# Patient Record
Sex: Female | Born: 1970 | Race: Black or African American | Hispanic: No | Marital: Married | Smoking: Never smoker
Health system: Southern US, Community
[De-identification: ages and names within clinical notes are randomized; demographics above are authoritative.]

## PROBLEM LIST (undated history)

## (undated) DIAGNOSIS — M1990 Unspecified osteoarthritis, unspecified site: Secondary | ICD-10-CM

## (undated) DIAGNOSIS — M069 Rheumatoid arthritis, unspecified: Secondary | ICD-10-CM

## (undated) DIAGNOSIS — Z973 Presence of spectacles and contact lenses: Secondary | ICD-10-CM

## (undated) HISTORY — DX: Rheumatoid arthritis, unspecified: M06.9

## (undated) HISTORY — PX: WISDOM TOOTH EXTRACTION: SHX21

## (undated) HISTORY — PX: DILATION AND CURETTAGE OF UTERUS: SHX78

---

## 2003-12-19 ENCOUNTER — Emergency Department (HOSPITAL_COMMUNITY): Admission: EM | Admit: 2003-12-19 | Discharge: 2003-12-19 | Payer: Self-pay | Admitting: Emergency Medicine

## 2007-04-05 ENCOUNTER — Emergency Department: Payer: Self-pay | Admitting: Emergency Medicine

## 2007-04-05 ENCOUNTER — Other Ambulatory Visit: Payer: Self-pay

## 2013-01-12 ENCOUNTER — Ambulatory Visit: Payer: Self-pay

## 2013-01-19 ENCOUNTER — Ambulatory Visit: Payer: Self-pay

## 2013-03-18 ENCOUNTER — Ambulatory Visit
Admission: RE | Admit: 2013-03-18 | Discharge: 2013-03-18 | Disposition: A | Discharge status: Home / Self Care | Payer: BC Managed Care – PPO | Source: Ambulatory Visit | Attending: Orthopedic Surgery | Admitting: Orthopedic Surgery

## 2013-03-18 ENCOUNTER — Other Ambulatory Visit: Payer: Self-pay | Admitting: Orthopedic Surgery

## 2013-03-18 DIAGNOSIS — M2011 Hallux valgus (acquired), right foot: Secondary | ICD-10-CM

## 2013-03-18 DIAGNOSIS — M2012 Hallux valgus (acquired), left foot: Secondary | ICD-10-CM

## 2013-05-13 ENCOUNTER — Ambulatory Visit (INDEPENDENT_AMBULATORY_CARE_PROVIDER_SITE_OTHER): Payer: BC Managed Care – PPO | Admitting: Podiatry

## 2013-05-13 ENCOUNTER — Ambulatory Visit (INDEPENDENT_AMBULATORY_CARE_PROVIDER_SITE_OTHER): Payer: BC Managed Care – PPO

## 2013-05-13 ENCOUNTER — Encounter: Payer: Self-pay | Admitting: Podiatry

## 2013-05-13 VITALS — BP 98/48 | HR 81 | Resp 16 | Ht 68.0 in | Wt 180.0 lb

## 2013-05-13 DIAGNOSIS — M79671 Pain in right foot: Secondary | ICD-10-CM

## 2013-05-13 DIAGNOSIS — M2010 Hallux valgus (acquired), unspecified foot: Secondary | ICD-10-CM

## 2013-05-13 DIAGNOSIS — M21619 Bunion of unspecified foot: Secondary | ICD-10-CM

## 2013-05-13 DIAGNOSIS — M79609 Pain in unspecified limb: Secondary | ICD-10-CM

## 2013-05-13 DIAGNOSIS — M069 Rheumatoid arthritis, unspecified: Secondary | ICD-10-CM

## 2013-05-13 DIAGNOSIS — B351 Tinea unguium: Secondary | ICD-10-CM

## 2013-05-13 NOTE — Progress Notes (Signed)
   Subjective:    Patient ID: Orinda Kenner, female    DOB: 1971/01/03, 43 y.o.   MRN: [id number]  HPI Comments: N pain L b/l bunions D 43yrs O slowly C worse A standing, walking T soaks in epson salt, rest, seen dr Montez Morita in November 43-x-rays,   Foot Pain Associated symptoms include fatigue.      Review of Systems  Constitutional: Positive for fatigue.  HENT: Negative.   Eyes: Positive for itching.  Respiratory: Negative.   Cardiovascular: Negative.   Gastrointestinal: Negative.   Endocrine: Negative.   Genitourinary: Negative.   Musculoskeletal:       Joint pain  Skin: Negative.   Allergic/Immunologic: Negative.   Neurological: Negative.   Hematological: Negative.   Psychiatric/Behavioral: Negative.        Objective:   Physical Exam        Assessment & Plan:

## 2013-05-15 NOTE — Progress Notes (Signed)
Subjective:     Patient ID: Sharon Valenzuela, female   DOB: 1970/09/16, 43 y.o.   MRN: [id number]  Foot Pain   patient presents with bunion deformity of both feet that have been quite symptomatic left over right and plantar keratotic lesion of the substance metatarsal both feet that become painful with pressure. States that she was supposed to have surgery over over the holiday but it did not work out and she has been referred to me   Review of Systems  All other systems reviewed and are negative.       Objective:   Physical Exam  Nursing note and vitals reviewed. Constitutional: She is oriented to person, place, and time.  Cardiovascular: Intact distal pulses.   Musculoskeletal: Normal range of motion.  Neurological: She is oriented to person, place, and time.  Skin: Skin is warm.   neurovascular status intact with no equinus condition normal range of motion of the subtalar midtarsal joint and muscle strength adequate both feet. Patient has structural hyperostosis medial aspect first metatarsal left with redness around the first metatarsal head and plantar keratotic lesion fifth metatarsal both feet that are painful when pressed. 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 I have recommended distal osteotomy even though I don't think we will be able to get full correction but I do think it will be adequate for her as her deformity clinically is not as bad as it is on x-ray and I did explain this option versus proximal osteotomy and the differences in recovery and risk. She has opted for distal osteotomy and we'll have Austin-type procedure along with metatarsal osteotomy and is scheduled for consult in 1 week for correction of left foot

## 2013-05-18 ENCOUNTER — Ambulatory Visit: Payer: Self-pay | Admitting: Podiatry

## 2013-05-20 ENCOUNTER — Ambulatory Visit: Payer: BC Managed Care – PPO | Admitting: Podiatry

## 2013-05-20 ENCOUNTER — Ambulatory Visit: Payer: Self-pay | Admitting: Podiatry

## 2013-06-03 ENCOUNTER — Ambulatory Visit: Payer: Self-pay | Admitting: Podiatry

## 2013-06-10 ENCOUNTER — Ambulatory Visit: Payer: Self-pay | Admitting: Podiatry

## 2013-07-01 ENCOUNTER — Encounter: Payer: Self-pay | Admitting: Podiatry

## 2013-07-01 NOTE — Progress Notes (Signed)
Pt cancelled surgery on 3.5.15. Pt states we kept changing her appt dates that she decided to go with another doctor.

## 2013-07-04 ENCOUNTER — Encounter (HOSPITAL_BASED_OUTPATIENT_CLINIC_OR_DEPARTMENT_OTHER): Payer: Self-pay | Admitting: *Deleted

## 2013-07-04 NOTE — Progress Notes (Signed)
Pt has RA-off most of meds now-no labs needed

## 2013-07-06 ENCOUNTER — Other Ambulatory Visit: Payer: Self-pay | Admitting: Orthopedic Surgery

## 2013-07-06 NOTE — H&P (Signed)
Sharon Valenzuela is an 43 y.o. female.   Chief Complaint: Left hallux valgus HPI: Pt reports to OR for surgical correction of her left foot hallux valgus.  Pt has failed conservative treatment and seeks surgical treatment for pain and deformity of her left MThallux joint.    Past Medical History  Diagnosis Date  . Rheumatoid arthritis(714.0)   . Wears glasses     Past Surgical History  Procedure Laterality Date  . Cesarean section  1994  . Dilation and curettage of uterus    . Wisdom tooth extraction      History reviewed. No pertinent family history. Social History:  reports that she has never smoked. She has never used smokeless tobacco. She reports that she drinks alcohol. She reports that she does not use illicit drugs.  Allergies: No Known Allergies  No prescriptions prior to admission    No results found for this or any previous visit (from the past 48 hour(s)). No results found.  Review of Systems  Constitutional: Negative.   HENT: Negative.   Eyes: Negative.   Respiratory: Negative.   Cardiovascular: Negative.   Gastrointestinal: Negative.   Musculoskeletal: Negative.   Skin: Negative.   Neurological: Negative for tremors.  Endo/Heme/Allergies: Negative.   Psychiatric/Behavioral: The patient is not nervous/anxious.     Height 5\' 8"  (1.727 m), weight 81.647 kg (180 lb), last menstrual period 02/03/2013. Physical Exam  42y/o WD WN female in NAD, A/Ox3, appears stated age.  EOMI, mood and affect normal, respirations unlabored.  On physical exam pt has valgus deformity of her left MT hallux joint.  Decreased ROM of the left MT hallux joint. +TTP to left MT hallux joint. DP pulses 2+ b/l. Normal sensation to light touch intact. Skin healthy and intact.  Assessment/Plan Pt reports to OR for left 1st MT SCARF osteotomy; modified McBride bunionectomy and possible Akin osteotomy.   FLOWERS, CHRISTOPHER S 07/06/2013, 3:43 PM   Pt seen and examined.  Agree with note  above.  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  The risks and benefits of the alternative treatment options have been discussed in detail.  The patient wishes to proceed with surgery and specifically understands risks of bleeding, infection, nerve damage, blood clots, need for additional surgery, amputation and death.

## 2013-07-07 ENCOUNTER — Ambulatory Visit (HOSPITAL_BASED_OUTPATIENT_CLINIC_OR_DEPARTMENT_OTHER)
Admission: RE | Admit: 2013-07-07 | Discharge: 2013-07-07 | Disposition: A | Discharge status: Home / Self Care | Payer: BC Managed Care – PPO | Source: Ambulatory Visit | Attending: Orthopedic Surgery | Admitting: Orthopedic Surgery

## 2013-07-07 ENCOUNTER — Encounter (HOSPITAL_BASED_OUTPATIENT_CLINIC_OR_DEPARTMENT_OTHER)
Admission: RE | Disposition: A | Discharge status: Home / Self Care | Payer: Self-pay | Source: Ambulatory Visit | Attending: Orthopedic Surgery

## 2013-07-07 ENCOUNTER — Ambulatory Visit (HOSPITAL_BASED_OUTPATIENT_CLINIC_OR_DEPARTMENT_OTHER): Payer: BC Managed Care – PPO | Admitting: Certified Registered"

## 2013-07-07 ENCOUNTER — Encounter (HOSPITAL_BASED_OUTPATIENT_CLINIC_OR_DEPARTMENT_OTHER): Payer: BC Managed Care – PPO | Admitting: Certified Registered"

## 2013-07-07 ENCOUNTER — Encounter (HOSPITAL_BASED_OUTPATIENT_CLINIC_OR_DEPARTMENT_OTHER): Payer: Self-pay | Admitting: Certified Registered"

## 2013-07-07 DIAGNOSIS — Q66219 Congenital metatarsus primus varus, unspecified foot: Secondary | ICD-10-CM | POA: Insufficient documentation

## 2013-07-07 DIAGNOSIS — M069 Rheumatoid arthritis, unspecified: Secondary | ICD-10-CM | POA: Insufficient documentation

## 2013-07-07 DIAGNOSIS — M2010 Hallux valgus (acquired), unspecified foot: Secondary | ICD-10-CM | POA: Insufficient documentation

## 2013-07-07 HISTORY — PX: METATARSAL OSTEOTOMY WITH BUNIONECTOMY: SHX5662

## 2013-07-07 HISTORY — DX: Presence of spectacles and contact lenses: Z97.3

## 2013-07-07 LAB — POCT HEMOGLOBIN-HEMACUE: Hemoglobin: 12.1 g/dL (ref 12.0–15.0)

## 2013-07-07 SURGERY — BUNIONECTOMY, WITH METATARSAL OSTEOTOMY
Anesthesia: General | Site: Foot | Laterality: Left

## 2013-07-07 MED ORDER — MIDAZOLAM HCL 2 MG/2ML IJ SOLN
INTRAMUSCULAR | Status: AC
  Filled 2013-07-07: qty 2

## 2013-07-07 MED ORDER — BACITRACIN ZINC 500 UNIT/GM EX OINT
TOPICAL_OINTMENT | CUTANEOUS | Status: DC | PRN
  Administered 2013-07-07: 1 via TOPICAL

## 2013-07-07 MED ORDER — ONDANSETRON HCL 4 MG/2ML IJ SOLN
INTRAMUSCULAR | Status: DC | PRN
  Administered 2013-07-07: 4 mg via INTRAVENOUS

## 2013-07-07 MED ORDER — FENTANYL CITRATE 0.05 MG/ML IJ SOLN
INTRAMUSCULAR | Status: DC | PRN
  Administered 2013-07-07: 25 ug via INTRAVENOUS

## 2013-07-07 MED ORDER — SUCCINYLCHOLINE CHLORIDE 20 MG/ML IJ SOLN
INTRAMUSCULAR | Status: AC
  Filled 2013-07-07: qty 1

## 2013-07-07 MED ORDER — LACTATED RINGERS IV SOLN
INTRAVENOUS | Status: DC
  Administered 2013-07-07 (×2): via INTRAVENOUS

## 2013-07-07 MED ORDER — DEXAMETHASONE SODIUM PHOSPHATE 10 MG/ML IJ SOLN
INTRAMUSCULAR | Status: DC | PRN
  Administered 2013-07-07: 10 mg via INTRAVENOUS

## 2013-07-07 MED ORDER — HYDROMORPHONE HCL PF 1 MG/ML IJ SOLN: 0.2500 mg | INTRAMUSCULAR | Status: DC | PRN

## 2013-07-07 MED ORDER — SODIUM CHLORIDE 0.9 % IV SOLN: INTRAVENOUS | Status: DC

## 2013-07-07 MED ORDER — BUPIVACAINE HCL (PF) 0.25 % IJ SOLN
INTRAMUSCULAR | Status: DC | PRN
Start: 2013-07-07 — End: 2013-07-07
  Administered 2013-07-07: 20 mL via PERINEURAL

## 2013-07-07 MED ORDER — FENTANYL CITRATE 0.05 MG/ML IJ SOLN
INTRAMUSCULAR | Status: AC
  Filled 2013-07-07: qty 2

## 2013-07-07 MED ORDER — METOCLOPRAMIDE HCL 5 MG/ML IJ SOLN: 10.0000 mg | Freq: Once | INTRAMUSCULAR | Status: DC | PRN

## 2013-07-07 MED ORDER — MIDAZOLAM HCL 5 MG/5ML IJ SOLN
INTRAMUSCULAR | Status: DC | PRN
  Administered 2013-07-07: 2 mg via INTRAVENOUS

## 2013-07-07 MED ORDER — FENTANYL CITRATE 0.05 MG/ML IJ SOLN
INTRAMUSCULAR | Status: AC
  Filled 2013-07-07: qty 6

## 2013-07-07 MED ORDER — ACETAMINOPHEN 500 MG PO TABS
ORAL_TABLET | ORAL | Status: AC
  Filled 2013-07-07: qty 2

## 2013-07-07 MED ORDER — PROPOFOL 10 MG/ML IV BOLUS
INTRAVENOUS | Status: DC | PRN
  Administered 2013-07-07: 200 mg via INTRAVENOUS

## 2013-07-07 MED ORDER — PROPOFOL 10 MG/ML IV EMUL
INTRAVENOUS | Status: AC
  Filled 2013-07-07: qty 100

## 2013-07-07 MED ORDER — BUPIVACAINE-EPINEPHRINE PF 0.5-1:200000 % IJ SOLN
INTRAMUSCULAR | Status: AC
  Filled 2013-07-07: qty 30

## 2013-07-07 MED ORDER — LIDOCAINE HCL (CARDIAC) 20 MG/ML IV SOLN
INTRAVENOUS | Status: DC | PRN
  Administered 2013-07-07: 30 mg via INTRAVENOUS

## 2013-07-07 MED ORDER — CHLORHEXIDINE GLUCONATE 4 % EX LIQD: 60.0000 mL | Freq: Once | CUTANEOUS | Status: DC

## 2013-07-07 MED ORDER — BUPIVACAINE-EPINEPHRINE PF 0.5-1:200000 % IJ SOLN
INTRAMUSCULAR | Status: DC | PRN
  Administered 2013-07-07: 20 mL via PERINEURAL

## 2013-07-07 MED ORDER — OXYCODONE HCL 5 MG PO TABS
ORAL_TABLET | ORAL | Status: AC
Start: 2013-07-07 — End: 2013-07-07
  Filled 2013-07-07: qty 1

## 2013-07-07 MED ORDER — MIDAZOLAM HCL 2 MG/2ML IJ SOLN
1.0000 mg | INTRAMUSCULAR | Status: DC | PRN
  Administered 2013-07-07: 2 mg via INTRAVENOUS

## 2013-07-07 MED ORDER — 0.9 % SODIUM CHLORIDE (POUR BTL) OPTIME
TOPICAL | Status: DC | PRN
  Administered 2013-07-07: 200 mL

## 2013-07-07 MED ORDER — ACETAMINOPHEN 500 MG PO TABS
1000.0000 mg | ORAL_TABLET | Freq: Once | ORAL | Status: AC
  Administered 2013-07-07: 1000 mg via ORAL

## 2013-07-07 MED ORDER — FENTANYL CITRATE 0.05 MG/ML IJ SOLN
50.0000 ug | INTRAMUSCULAR | Status: DC | PRN
Start: 2013-07-07 — End: 2013-07-07
  Administered 2013-07-07: 100 ug via INTRAVENOUS

## 2013-07-07 MED ORDER — CEFAZOLIN SODIUM-DEXTROSE 2-3 GM-% IV SOLR
2.0000 g | INTRAVENOUS | Status: AC
  Administered 2013-07-07: 2 g via INTRAVENOUS

## 2013-07-07 MED ORDER — BACITRACIN ZINC 500 UNIT/GM EX OINT
TOPICAL_OINTMENT | CUTANEOUS | Status: AC
  Filled 2013-07-07: qty 28.35

## 2013-07-07 MED ORDER — OXYCODONE HCL 5 MG PO TABS: 5.0000 mg | ORAL_TABLET | ORAL | Status: DC | PRN

## 2013-07-07 MED ORDER — OXYCODONE HCL 5 MG/5ML PO SOLN: 5.0000 mg | Freq: Once | ORAL | Status: AC | PRN

## 2013-07-07 MED ORDER — OXYCODONE HCL 5 MG PO TABS
5.0000 mg | ORAL_TABLET | Freq: Once | ORAL | Status: AC | PRN
  Administered 2013-07-07: 5 mg via ORAL

## 2013-07-07 MED ORDER — CEFAZOLIN SODIUM-DEXTROSE 2-3 GM-% IV SOLR
INTRAVENOUS | Status: AC
  Filled 2013-07-07: qty 50

## 2013-07-07 SURGICAL SUPPLY — 69 items
BANDAGE ESMARK 6X9 LF (GAUZE/BANDAGES/DRESSINGS) ×1 IMPLANT
BIT DRILL 1.7 LOW PROFILE (BIT) ×2 IMPLANT
BLADE AVERAGE 25X9 (BLADE) ×2 IMPLANT
BLADE SURG 15 STRL LF DISP TIS (BLADE) ×2 IMPLANT
BLADE SURG 15 STRL SS (BLADE) ×2
BNDG COHESIVE 4X5 TAN STRL (GAUZE/BANDAGES/DRESSINGS) ×2 IMPLANT
BNDG COHESIVE 6X5 TAN STRL LF (GAUZE/BANDAGES/DRESSINGS) IMPLANT
BNDG CONFORM 3 STRL LF (GAUZE/BANDAGES/DRESSINGS) ×2 IMPLANT
BNDG ESMARK 6X9 LF (GAUZE/BANDAGES/DRESSINGS) ×2
CHLORAPREP W/TINT 26ML (MISCELLANEOUS) ×2 IMPLANT
COVER TABLE BACK 60X90 (DRAPES) ×2 IMPLANT
CUFF TOURNIQUET SINGLE 24IN (TOURNIQUET CUFF) IMPLANT
CUFF TOURNIQUET SINGLE 34IN LL (TOURNIQUET CUFF) ×2 IMPLANT
DRAPE EXTREMITY T 121X128X90 (DRAPE) ×2 IMPLANT
DRAPE OEC MINIVIEW 54X84 (DRAPES) ×2 IMPLANT
DRAPE U-SHAPE 47X51 STRL (DRAPES) ×2 IMPLANT
DRAPE U-SHAPE 76X120 STRL (DRAPES) IMPLANT
DRSG EMULSION OIL 3X3 NADH (GAUZE/BANDAGES/DRESSINGS) ×2 IMPLANT
ELECT REM PT RETURN 9FT ADLT (ELECTROSURGICAL) ×2
ELECTRODE REM PT RTRN 9FT ADLT (ELECTROSURGICAL) ×1 IMPLANT
GAUZE SPONGE 4X4 16PLY XRAY LF (GAUZE/BANDAGES/DRESSINGS) IMPLANT
GLOVE BIO SURGEON STRL SZ8 (GLOVE) ×2 IMPLANT
GLOVE BIOGEL PI IND STRL 7.0 (GLOVE) ×1 IMPLANT
GLOVE BIOGEL PI IND STRL 7.5 (GLOVE) ×1 IMPLANT
GLOVE BIOGEL PI IND STRL 8 (GLOVE) ×1 IMPLANT
GLOVE BIOGEL PI INDICATOR 7.0 (GLOVE) ×1
GLOVE BIOGEL PI INDICATOR 7.5 (GLOVE) ×1
GLOVE BIOGEL PI INDICATOR 8 (GLOVE) ×1
GLOVE ECLIPSE 6.5 STRL STRAW (GLOVE) ×2 IMPLANT
GLOVE ECLIPSE 7.0 STRL STRAW (GLOVE) ×2 IMPLANT
GLOVE EXAM NITRILE MD LF STRL (GLOVE) ×2 IMPLANT
GOWN STRL REUS W/ TWL LRG LVL3 (GOWN DISPOSABLE) ×1 IMPLANT
GOWN STRL REUS W/ TWL XL LVL3 (GOWN DISPOSABLE) ×1 IMPLANT
GOWN STRL REUS W/TWL LRG LVL3 (GOWN DISPOSABLE) ×1
GOWN STRL REUS W/TWL XL LVL3 (GOWN DISPOSABLE) ×1
GUIDEWIRE .08 (WIRE) ×2 IMPLANT
NS IRRIG 1000ML POUR BTL (IV SOLUTION) ×2 IMPLANT
PACK BASIN DAY SURGERY FS (CUSTOM PROCEDURE TRAY) ×2 IMPLANT
PAD CAST 4YDX4 CTTN HI CHSV (CAST SUPPLIES) ×1 IMPLANT
PADDING CAST ABS 4INX4YD NS (CAST SUPPLIES) ×1
PADDING CAST ABS COTTON 4X4 ST (CAST SUPPLIES) ×1 IMPLANT
PADDING CAST COTTON 4X4 STRL (CAST SUPPLIES) ×1
PENCIL BUTTON HOLSTER BLD 10FT (ELECTRODE) ×2 IMPLANT
SANITIZER HAND PURELL 535ML FO (MISCELLANEOUS) ×2 IMPLANT
SCREW CORTICAL 2.3X16 (Screw) ×2 IMPLANT
SCREW CORTICAL 2.3X18 (Screw) ×2 IMPLANT
SCREW LAG CANN 2.3X20MM (Screw) ×2 IMPLANT
SHEET MEDIUM DRAPE 40X70 STRL (DRAPES) ×2 IMPLANT
SLEEVE SCD COMPRESS KNEE MED (MISCELLANEOUS) ×2 IMPLANT
SPONGE GAUZE 4X4 12PLY (GAUZE/BANDAGES/DRESSINGS) ×2 IMPLANT
SPONGE LAP 18X18 X RAY DECT (DISPOSABLE) ×2 IMPLANT
STOCKINETTE 6  STRL (DRAPES) ×1
STOCKINETTE 6 STRL (DRAPES) ×1 IMPLANT
SUCTION FRAZIER TIP 10 FR DISP (SUCTIONS) IMPLANT
SUT ETHIBOND 3-0 V-5 (SUTURE) IMPLANT
SUT ETHILON 3 0 PS 1 (SUTURE) ×2 IMPLANT
SUT MNCRL AB 3-0 PS2 18 (SUTURE) ×2 IMPLANT
SUT VIC AB 0 SH 27 (SUTURE) IMPLANT
SUT VIC AB 2-0 SH 27 (SUTURE) ×1
SUT VIC AB 2-0 SH 27XBRD (SUTURE) ×1 IMPLANT
SUT VIC AB 3-0 PS1 18 (SUTURE)
SUT VIC AB 3-0 PS1 18XBRD (SUTURE) IMPLANT
SYR BULB 3OZ (MISCELLANEOUS) ×2 IMPLANT
SYR CONTROL 10ML LL (SYRINGE) IMPLANT
TOWEL OR 17X24 6PK STRL BLUE (TOWEL DISPOSABLE) ×2 IMPLANT
TOWEL OR NON WOVEN STRL DISP B (DISPOSABLE) IMPLANT
TUBE CONNECTING 20X1/4 (TUBING) IMPLANT
UNDERPAD 30X30 INCONTINENT (UNDERPADS AND DIAPERS) ×2 IMPLANT
YANKAUER SUCT BULB TIP NO VENT (SUCTIONS) IMPLANT

## 2013-07-07 NOTE — Anesthesia Preprocedure Evaluation (Addendum)
Anesthesia Evaluation  Patient identified by MRN, date of birth, ID band Patient awake    Reviewed: Allergy & Precautions, H&P , NPO status , Patient's Chart, lab work & pertinent test results, reviewed documented beta blocker date and time   Airway Mallampati: II TM Distance: >3 FB Neck ROM: full    Dental   Pulmonary neg pulmonary ROS,  breath sounds clear to auscultation        Cardiovascular negative cardio ROS  Rhythm:regular     Neuro/Psych negative neurological ROS  negative psych ROS   GI/Hepatic negative GI ROS, Neg liver ROS,   Endo/Other  negative endocrine ROS  Renal/GU negative Renal ROS  negative genitourinary   Musculoskeletal  (+) Arthritis -, Rheumatoid disorders,    Abdominal   Peds  Hematology negative hematology ROS (+)   Anesthesia Other Findings See surgeon's H&P   Reproductive/Obstetrics negative OB ROS                           Anesthesia Physical Anesthesia Plan  ASA: II  Anesthesia Plan: General   Post-op Pain Management:    Induction: Intravenous  Airway Management Planned: LMA  Additional Equipment:   Intra-op Plan:   Post-operative Plan:   Informed Consent: I have reviewed the patients History and Physical, chart, labs and discussed the procedure including the risks, benefits and alternatives for the proposed anesthesia with the patient or authorized representative who has indicated his/her understanding and acceptance.   Dental Advisory Given  Plan Discussed with: CRNA and Surgeon  Anesthesia Plan Comments:         Anesthesia Quick Evaluation

## 2013-07-07 NOTE — Discharge Instructions (Signed)
Toni Arthurs, MD [facility]  Please read the following information regarding your care after surgery.  Medications  You only need a prescription for the narcotic pain medicine (ex. oxycodone, Percocet, Norco).  All of the other medicines listed below are available over the counter. X acetominophen (Tylenol) 650 mg every 4-6 hours as you need for minor pain X oxycodone as prescribed for moderate to severe pain  Narcotic pain medicine (ex. oxycodone, Percocet, Vicodin) will cause constipation.  To prevent this problem, take the following medicines while you are taking any pain medicine. X docusate sodium (Colace) 100 mg twice a day X senna (Senokot) 2 tablets twice a day  X To help prevent blood clots, take an aspirin (325 mg) once a day for a month after surgery.  You should also get up every hour while you are awake to move around.    Weight Bearing ? Bear weight when you are able on your operated leg or foot. ? Bear weight only on the heel of your operated foot in the post-op shoe. X Do not bear any weight on the operated leg or foot.  Cast / Splint / Dressing X Keep your splint or cast clean and dry.  Dont put anything (coat hanger, pencil, etc) down inside of it.  If it gets damp, use a hair dryer on the cool setting to dry it.  If it gets soaked, call the office to schedule an appointment for a cast change. ? Remove your dressing 3 days after surgery and cover the incisions with dry dressings.    After your dressing, cast or splint is removed; you may shower, but do not soak or scrub the wound.  Allow the water to run over it, and then gently pat it dry.  Swelling It is normal for you to have swelling where you had surgery.  To reduce swelling and pain, keep your toes above your nose for at least 3 days after surgery.  It may be necessary to keep your foot or leg elevated for several weeks.  If it hurts, it should be elevated.  Follow Up Call my office at [phone]  when you are discharged from the hospital or surgery center to schedule an appointment to be seen two weeks after surgery.  Call my office at [phone] if you develop a fever >101.5 F, nausea, vomiting, bleeding from the surgical site or severe pain.     Post Anesthesia Home Care Instructions  Activity: Get plenty of rest for the remainder of the day. A responsible adult should stay with you for 24 hours following the procedure.  For the next 24 hours, DO NOT: -Drive a car -Advertising copywriter -Drink alcoholic beverages -Take any medication unless instructed by your physician -Make any legal decisions or sign important papers.  Meals: Start with liquid foods such as gelatin or soup. Progress to regular foods as tolerated. Avoid greasy, spicy, heavy foods. If nausea and/or vomiting occur, drink only clear liquids until the nausea and/or vomiting subsides. Call your physician if vomiting continues.  Special Instructions/Symptoms: Your throat may feel dry or sore from the anesthesia or the breathing tube placed in your throat during surgery. If this causes discomfort, gargle with warm salt water. The discomfort should disappear within 24 hours.  Call your surgeon if you experience:   1.  Fever over 101.0. 2.  Inability to urinate. 3.  Nausea and/or vomiting. 4.  Extreme swelling or bruising at the surgical site. 5.  Continued bleeding from the  incision. 6.  Increased pain, redness or drainage from the incision. 7.  Problems related to your pain medication.

## 2013-07-07 NOTE — Anesthesia Postprocedure Evaluation (Signed)
Anesthesia Post Note  Patient: Sharon Valenzuela  Procedure(s) Performed: Procedure(s) (LRB): LEFT FIRST METATARSAL SCARF OSTEOTOMY MODIFIED MCBRIDE BUNIONECTOMY WITH  AKIN OSTEOTOMY (Left)  Anesthesia type: General  Patient location: PACU  Post pain: Pain level controlled  Post assessment: Patient's Cardiovascular Status Stable  Last Vitals:  Filed Vitals:   07/07/13 1006  BP:   Pulse: 78  Temp:   Resp: 14    Post vital signs: Reviewed and stable  Level of consciousness: alert  Complications: No apparent anesthesia complications

## 2013-07-07 NOTE — Brief Op Note (Signed)
07/07/2013  8:55 AM  PATIENT:  Orinda Kenner  43 y.o. female  PRE-OPERATIVE DIAGNOSIS:  LEFT HALLUX VALGUS and metatarsus primus varus  POST-OPERATIVE DIAGNOSIS:  same  Procedure(s): 1.  Left 1st metatarsal scarf osteotomy 2.  Left modified McBride bunionectomy 3.  Left hallux Akin osteotomy 4.  Left foot AP and lateral radiographs  SURGEON:  Toni Arthurs, MD  ASSISTANT: n/a  ANESTHESIA:   General, regional  EBL:  minimal   TOURNIQUET:   Total Tourniquet Time Documented: Thigh (Left) - 57 minutes Total: Thigh (Left) - 57 minutes   COMPLICATIONS:  None apparent  DISPOSITION:  Extubated, awake and stable to recovery.  DICTATION ID:  [id number]

## 2013-07-07 NOTE — Progress Notes (Signed)
Assisted Dr. Frederick with left, ultrasound guided, popliteal/saphenous block. Side rails up, monitors on throughout procedure. See vital signs in flow sheet. Tolerated Procedure well. 

## 2013-07-07 NOTE — Anesthesia Procedure Notes (Addendum)
Anesthesia Regional Block:  Popliteal block  Pre-Anesthetic Checklist: ,, timeout performed, Correct Patient, Correct Site, Correct Laterality, Correct Procedure, Correct Position, site marked, Risks and benefits discussed,  Surgical consent,  Pre-op evaluation,  At surgeon's request and post-op pain management  Laterality: Left  Prep: chloraprep       Needles:   Needle Type: Other     Needle Length: 9cm 9 cm Needle Gauge: 21 and 21 G    Additional Needles:  Procedures: ultrasound guided (picture in chart) Popliteal block Narrative:  Start time: 07/07/2013 7:20 AM End time: 07/07/2013 7:26 AM Injection made incrementally with aspirations every 5 mL.  Performed by: Personally  Anesthesiologist: Aldona Lento, MD  Additional Notes: Ultrasound guidance used to: id relevant anatomy, confirm needle position, local anesthetic spread, avoidance of vascular puncture. Picture saved. No complications. Block performed personally by Janetta Hora. Gelene Mink, MD  .     Procedure Name: LMA Insertion Date/Time: 07/07/2013 7:36 AM Performed by: Valente Fosberg Pre-anesthesia Checklist: Patient identified, Emergency Drugs available, Suction available and Patient being monitored Patient Re-evaluated:Patient Re-evaluated prior to inductionOxygen Delivery Method: Circle System Utilized Preoxygenation: Pre-oxygenation with 100% oxygen Intubation Type: IV induction Ventilation: Mask ventilation without difficulty LMA: LMA inserted LMA Size: 4.0 Number of attempts: 1 Airway Equipment and Method: bite block Placement Confirmation: positive ETCO2 Tube secured with: Tape Dental Injury: Teeth and Oropharynx as per pre-operative assessment

## 2013-07-07 NOTE — Transfer of Care (Signed)
Immediate Anesthesia Transfer of Care Note  Patient: Sharon Valenzuela  Procedure(s) Performed: Procedure(s): LEFT FIRST METATARSAL SCARF OSTEOTOMY MODIFIED MCBRIDE BUNIONECTOMY WITH  AKIN OSTEOTOMY (Left)  Patient Location: PACU  Anesthesia Type:GA combined with regional for post-op pain  Level of Consciousness: awake, alert , oriented and patient cooperative  Airway & Oxygen Therapy: Patient Spontanous Breathing and Patient connected to face mask oxygen  Post-op Assessment: Report given to PACU RN and Post -op Vital signs reviewed and stable  Post vital signs: Reviewed and stable  Complications: No apparent anesthesia complications

## 2013-07-08 ENCOUNTER — Encounter (HOSPITAL_BASED_OUTPATIENT_CLINIC_OR_DEPARTMENT_OTHER): Payer: Self-pay | Admitting: Orthopedic Surgery

## 2013-07-08 NOTE — Op Note (Signed)
Sharon Valenzuela, Sharon Valenzuela NO.:  [id number]  MEDICAL RECORD NO.:  [id number]  LOCATION:                               FACILITY:  [facility]  PHYSICIAN:  Toni Arthurs, MD        DATE OF BIRTH:  Aug 10, 1970  DATE OF PROCEDURE:  07/07/2013 DATE OF DISCHARGE:  07/07/2013                              OPERATIVE REPORT   PREOPERATIVE DIAGNOSES: 1. Left hallux valgus. 2. Left metatarsus primus varus.  POSTOPERATIVE DIAGNOSES: 1. Left hallux valgus. 2. Left metatarsus primus varus. 3. Left hallux valgus interphalangeus.  PROCEDURE: 1. Left first metatarsal Scarf osteotomy. 2. Left modified McBride bunionectomy. 3. Left hallux Akin osteotomy. 4. Left foot AP and lateral radiographs.  SURGEON:  Toni Arthurs, MD  ANESTHESIA:  General, regional.  ESTIMATED BLOOD LOSS:  Minimal.  TOURNIQUET TIME:  57 minutes at 250 mmHg.  COMPLICATIONS:  None apparent.  DISPOSITION:  Extubated, awake, and stable to recovery.  INDICATIONS FOR PROCEDURE:  The patient is a 43 year old woman with past medical history significant for rheumatoid arthritis.  She has a painful bunion deformity, as well as metatarsus primus varus.  She presents now for operative treatment of this painful condition having failed nonoperative treatment.  She understands the risks and benefits, the alternative treatment options, and elects surgical treatment.  She specifically understands risks of bleeding, infection, nerve damage, blood clots, need for additional surgery, recurrence of her bunion amputation and death.  PROCEDURE IN DETAIL:  After preoperative consent was obtained and the correct operative site was identified, the patient was brought to the operating room and placed supine on the operating table.  General anesthesia was induced.  Preoperative antibiotics were administered.  A surgical time-out was taken.  The left lower extremity was prepped and draped in standard sterile fashion with the  tourniquet around the thigh. The extremity was exsanguinated.  The tourniquet was inflated to 250 mmHg.  A longitudinal incision was made at the dorsum of the first web space.  Sharp dissection was carried down through the skin and subcutaneous tissue.  The intermetatarsal ligament was identified.  It was divided under direct vision. A arthrotomy rather was made in the lateral joint capsule between the sesamoid and the metatarsal head.  Several perforations were then made in the lateral joint capsule adjacent to the first MTP joint.  The hallux could then be positioned in 20 degrees of varus passively.  Attention was then turned to the medial eminence were a longitudinal incision was made.  Sharp dissection was carried down through the skin and subcutaneous tissue.  Medial joint capsule was incised in line with its fibers, and elevated plantarly and dorsally.  The medial eminence was resected in line with the first metatarsal shaft.  The corners of the scarf osteotomy were then marked with 0.054 K-wires.  An oscillating saw was used to make the osteotomy removing a small wedge of bone proximally.  K-wires were removed.  The osteotomy was mobilized.  The head was translated laterally to correct the intermetatarsal angle.  The correction was then provisionally held with a tenaculum.  An AP radiograph confirmed appropriate correction of the intermetatarsal angle and coverage of the  sesamoids.  The osteotomy was then fixed with a unicortical 2.3-mm screw distally and a bicortical 2.3-mm screw proximally.  These were Arthrex quick fix screws.  The overhanging bone medially was resected with the oscillating saw. The wound was irrigated copiously.  Decision was made at that time to proceed with an Akin osteotomy to correct the residual hallux valgus interphalangeus.  The distal portion of the incision was extended along the midline of the hallux.  Subperiosteal dissection was carried dorsally  and plantarly.  Hohmann elevators were placed to protect the flexor and extensor tendons.  The closing wedge osteotomy was then made at the base of the proximal phalanx removing a small wedge of bone medially.  The osteotomy was closed down and fixed with a K-wire, followed by a 2.3 mm cannulated partially-threaded screw.  The screw was noted to have appropriate purchase.  AP and lateral radiographs then confirmed appropriate position and length of all hardware and appropriate correction of the hallux valgus, intermetatarsal and hallux valgus interphalangeus ankles.  Both wounds were then irrigated copiously.  The medial joint capsule was repaired with imbricating sutures of 2-0 Vicryl.  Subcutaneous tissue was approximated with an inverted simple sutures of 3-0 Monocryl and running 3-0 nylon sutures were used to close the incisions.  Sterile dressings were applied, followed by a blunt dressing.  The tourniquet was released at 57 minutes after application of the dressings.  The patient was awakened by anesthesia and transported to the recovery room in stable condition.  FOLLOWUP PLAN:  The patient will be weightbearing as tolerated on her left foot and a DARCO wedge style shoe.  She will follow up with me in 2 weeks for suture removal.  RADIOGRAPHS:  AP and lateral radiographs of the left foot were obtained intraoperatively today.  These show interval correction of the hallux valgus, intermetatarsal and hallux valgus interphalangeus angles.  The hardware was appropriately positioned and of the appropriate length.  No acute injuries noted.    Toni Arthurs, MD   ______________________________ Toni Arthurs, MD   JH/MEDQ  D:  07/07/2013  T:  07/08/2013  Job:  [id number]

## 2013-07-15 ENCOUNTER — Encounter: Payer: BC Managed Care – PPO | Admitting: Podiatry

## 2013-08-06 ENCOUNTER — Encounter (HOSPITAL_COMMUNITY): Payer: Self-pay | Admitting: Emergency Medicine

## 2013-08-06 ENCOUNTER — Emergency Department (INDEPENDENT_AMBULATORY_CARE_PROVIDER_SITE_OTHER)
Admission: EM | Admit: 2013-08-06 | Discharge: 2013-08-06 | Disposition: A | Discharge status: Home / Self Care | Payer: BC Managed Care – PPO | Source: Home / Self Care | Attending: Family Medicine | Admitting: Family Medicine

## 2013-08-06 DIAGNOSIS — T8130XA Disruption of wound, unspecified, initial encounter: Secondary | ICD-10-CM

## 2013-08-06 DIAGNOSIS — T8131XA Disruption of external operation (surgical) wound, not elsewhere classified, initial encounter: Secondary | ICD-10-CM

## 2013-08-06 MED ORDER — CLINDAMYCIN HCL 300 MG PO CAPS: 300.0000 mg | ORAL_CAPSULE | Freq: Three times a day (TID) | ORAL | Status: DC

## 2013-08-06 NOTE — ED Notes (Signed)
Pt c/o left foot swelling onset x1 week Reports she had a bunionectomy done on 03/05 States the swelling started after she hit the edge of a plastic heater?? Denies pain, fevers Saw the ortho surgeon 2 weeks ago Alert w/no signs of acute distress.

## 2013-08-06 NOTE — ED Provider Notes (Signed)
Medical screening examination/treatment/procedure(s) were performed by resident physician or non-physician practitioner and as supervising physician I was immediately available for consultation/collaboration.   KINDL,JAMES DOUGLAS MD.   James D Kindl, MD 08/06/13 1755 

## 2013-08-06 NOTE — Discharge Instructions (Signed)
Wound Dehiscence  Wound dehiscence is when a surgical cut (incision) breaks open and does not heal properly after surgery. It usually happens 7 10 days after surgery. This can be a serious condition. It is important to identify and treat this condition early.   CAUSES   Some common causes of wound dehiscence include:  · Stretching of the wound area. This may be caused by lifting, vomiting, violent coughing, or straining during bowel movements.  · Wound infection.  · Early stitch (suture) removal.  RISK FACTORS  Various things can increase your risk of developing wound dehiscence, including:  · Obesity.  · Lung disease.  · Smoking.  · Poor nutrition.  · Contamination during surgery.  SIGNS AND SYMPTOMS  · Bleeding from the wound.  · Pain.  · Fever.  · Wound starts breaking open.  DIAGNOSIS   · Your health care provider may diagnose wound dehiscence by monitoring the incision and noting any changes in the wound. These changes can include an increase in drainage or pain. The health care provider may also ask you if you have noticed any stretching or tearing of the wound.  · Wound cultures may be taken to determine if there is an infection.   · Imaging studies, such as an MRI scan or CT scan, may be done to determine if there is a collection of pus or fluid in the wound area.  TREATMENT  Treatment may include:  · Wound care.  · Surgical repair.  · Antibiotic medicine to treat or prevent infection.  · Medicines to reduce pain and swelling.  HOME CARE INSTRUCTIONS   · Only take over-the-counter or prescription medicines for pain, discomfort, or fever as directed by your health care provider. Taking pain medicine 30 minutes before changing a bandage (dressing) can help relieve pain.  · Take your antibiotics as directed. Finish them even if you start to feel better.  · Gently wash the area with mild soap and water 2 times a day, or as directed. Rinse off the soap. Pat the area dry with a clean towel. Do not rub the wound.  This may cause bleeding.  · Follow your health care provider's instructions for how often you need to change the dressing and packing inside. Wash your hands well before and after changing your dressing. Apply a dressing to the wound as directed.  · Take showers. Do not take tub baths, swim, or do anything that may soak the wound until it is healed.  · Avoid exercises that make you sweat heavily.  · Use anti-itch medicine as directed by your health care provider. The wound may itch when it is healing. Do not pick or scratch at the wound.  · Do not lift more than 10 pounds (4.5 kg) until the wound is healed, or as directed by your health care provider.  · Keep all follow-up appointments as directed.  SEEK MEDICAL CARE IF:  · You have excessive bleeding from your surgical wound.  · Your wound does not seem to be healing properly.  SEEK IMMEDIATE MEDICAL CARE IF:   · You have increased swelling or redness around the wound.  · You have increasing pain in the wound.  · You have an increasing amount of pus coming from the wound.  · Your wound breaks open farther.  · You have a fever.  MAKE SURE YOU:   · Understand these instructions.  · Will watch your condition.  · Will get help right away if you are not   doing well or get worse.  Document Released: 07/12/2003 Document Revised: 02/09/2013 Document Reviewed: 12/27/2012  [employer]® Patient Information ©2014 [employer].

## 2013-08-06 NOTE — ED Provider Notes (Signed)
CSN: [id number]     Arrival date & time 08/06/13  1428 History   First MD Initiated Contact with Patient 08/06/13 1507     Chief Complaint  Patient presents with  . Foot Swelling   (Consider location/radiation/quality/duration/timing/severity/associated sxs/prior Treatment) HPI Comments: 43 year old female presents complaining of left foot swelling for about a week. She had a bunionectomy one month ago and and was doing well until 2 weeks ago when she accidentally kicked something. Since then, she has had progressive swelling and now seems at the incision site started to pull apart by the end of the day. She is still wearing a walking boot and she thinks this may be making it worse. She saw her orthopedic surgeon immediately after she kicked something 2 weeks ago but has not followed up there since. She says there is some foul-smelling drainage from the wound is well. There is no pain in the wound   Past Medical History  Diagnosis Date  . Rheumatoid arthritis(714.0)   . Wears glasses    Past Surgical History  Procedure Laterality Date  . Cesarean section  1994  . Dilation and curettage of uterus    . Wisdom tooth extraction    . Metatarsal osteotomy with bunionectomy Left 07/07/2013    Procedure: LEFT FIRST METATARSAL SCARF OSTEOTOMY MODIFIED MCBRIDE BUNIONECTOMY WITH  AKIN OSTEOTOMY;  Surgeon: Toni Arthurs, MD;  Location: [facility];  Service: Orthopedics;  Laterality: Left;   No family history on file. History  Substance Use Topics  . Smoking status: Never Smoker   . Smokeless tobacco: Never Used  . Alcohol Use: Yes     Comment: weekends/social   OB History   Grav Para Term Preterm Abortions TAB SAB Ect Mult Living                 Review of Systems  Constitutional: Positive for fever and chills.  Skin: Positive for wound. Negative for color change.  Neurological: Negative for numbness.  All other systems reviewed and are negative.    Allergies  Review of  patient's allergies indicates no known allergies.  Home Medications   Current Outpatient Rx  Name  Route  Sig  Dispense  Refill  . cholecalciferol (VITAMIN D) 1000 UNITS tablet   Oral   Take 1,000 Units by mouth daily.         . clindamycin (CLEOCIN) 300 MG capsule   Oral   Take 1 capsule (300 mg total) by mouth 3 (three) times daily.   30 capsule   0   . oxyCODONE (ROXICODONE) 5 MG immediate release tablet   Oral   Take 1-2 tablets (5-10 mg total) by mouth every 4 (four) hours as needed for moderate pain or severe pain.   30 tablet   0   . vitamin B-12 (CYANOCOBALAMIN) 100 MCG tablet   Oral   Take 100 mcg by mouth daily.          BP 130/85  Pulse 77  Temp(Src) 98.5 F (36.9 C) (Oral)  Resp 18  SpO2 100% Physical Exam  Nursing note and vitals reviewed. Constitutional: She is oriented to person, place, and time. Vital signs are normal. She appears well-developed and well-nourished. No distress.  HENT:  Head: Normocephalic and atraumatic.  Pulmonary/Chest: Effort normal. No respiratory distress.  Musculoskeletal:       Feet:  Neurological: She is alert and oriented to person, place, and time. She has normal strength. Coordination normal.  Skin: Skin is warm  and dry. No rash noted. She is not diaphoretic.  Psychiatric: She has a normal mood and affect. Judgment normal.    ED Course  Procedures (including critical care time) Labs Review Labs Reviewed - No data to display Imaging Review No results found.   MDM   1. Wound dehiscence    Wound steri-stripped to hold it closed.  Will Rx cleocin for possible early wound infection or infection PPx.  She will keep the foot elevated and rest, and will follow up with her orthopedic surgeon on Monday    Meds ordered this encounter  Medications  . DISCONTD: clindamycin (CLEOCIN) 300 MG capsule    Sig: Take 1 capsule (300 mg total) by mouth 3 (three) times daily.    Dispense:  30 capsule    Refill:  0    Order  Specific Question:  Supervising Provider    Answer:  Linna Hoff [phone]  . clindamycin (CLEOCIN) 300 MG capsule    Sig: Take 1 capsule (300 mg total) by mouth 3 (three) times daily.    Dispense:  30 capsule    Refill:  0    Order Specific Question:  Supervising Provider    Answer:  Bradd Canary D [[id number]]       Graylon Good, PA-C 08/06/13 1539

## 2014-04-20 IMAGING — CR DG FOOT COMPLETE 3+V*L*
3 series · 3 of 3 positions shown · non-contrast
Comparison: None.

CLINICAL DATA: 42-year-old female with bunion of the left foot 1st
toe. Initial encounter.

EXAM:
LEFT FOOT - COMPLETE 3+ VIEW

[t foot ap left]
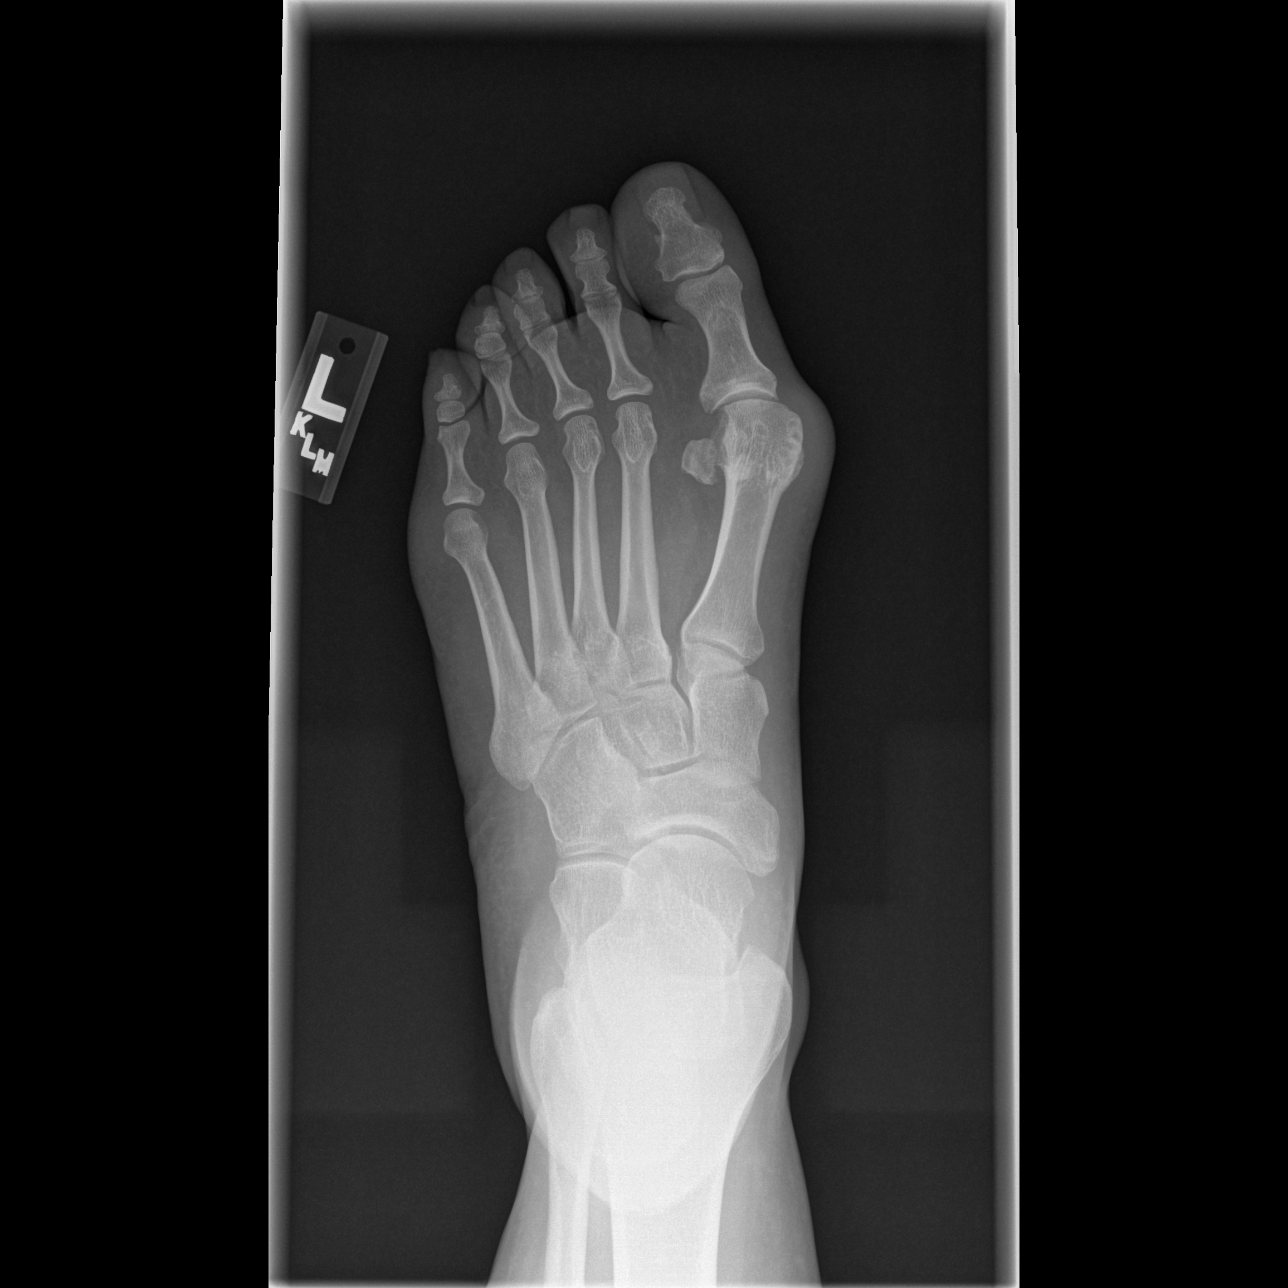

[t foot oblique left]
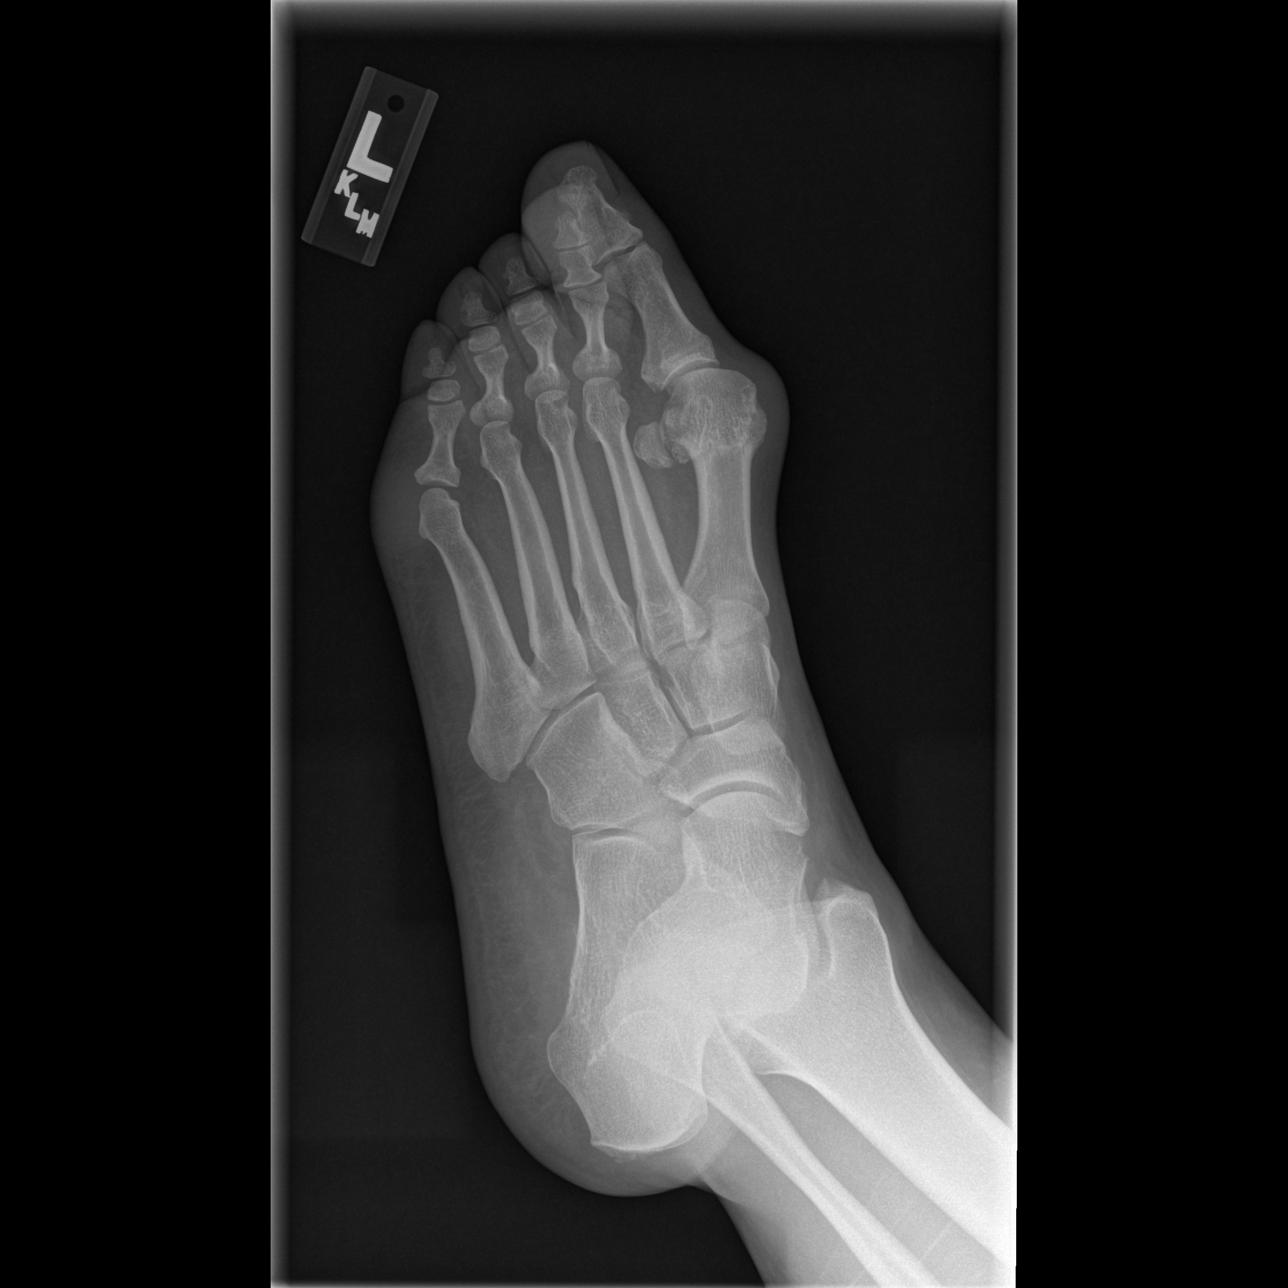

[t foot lat left]
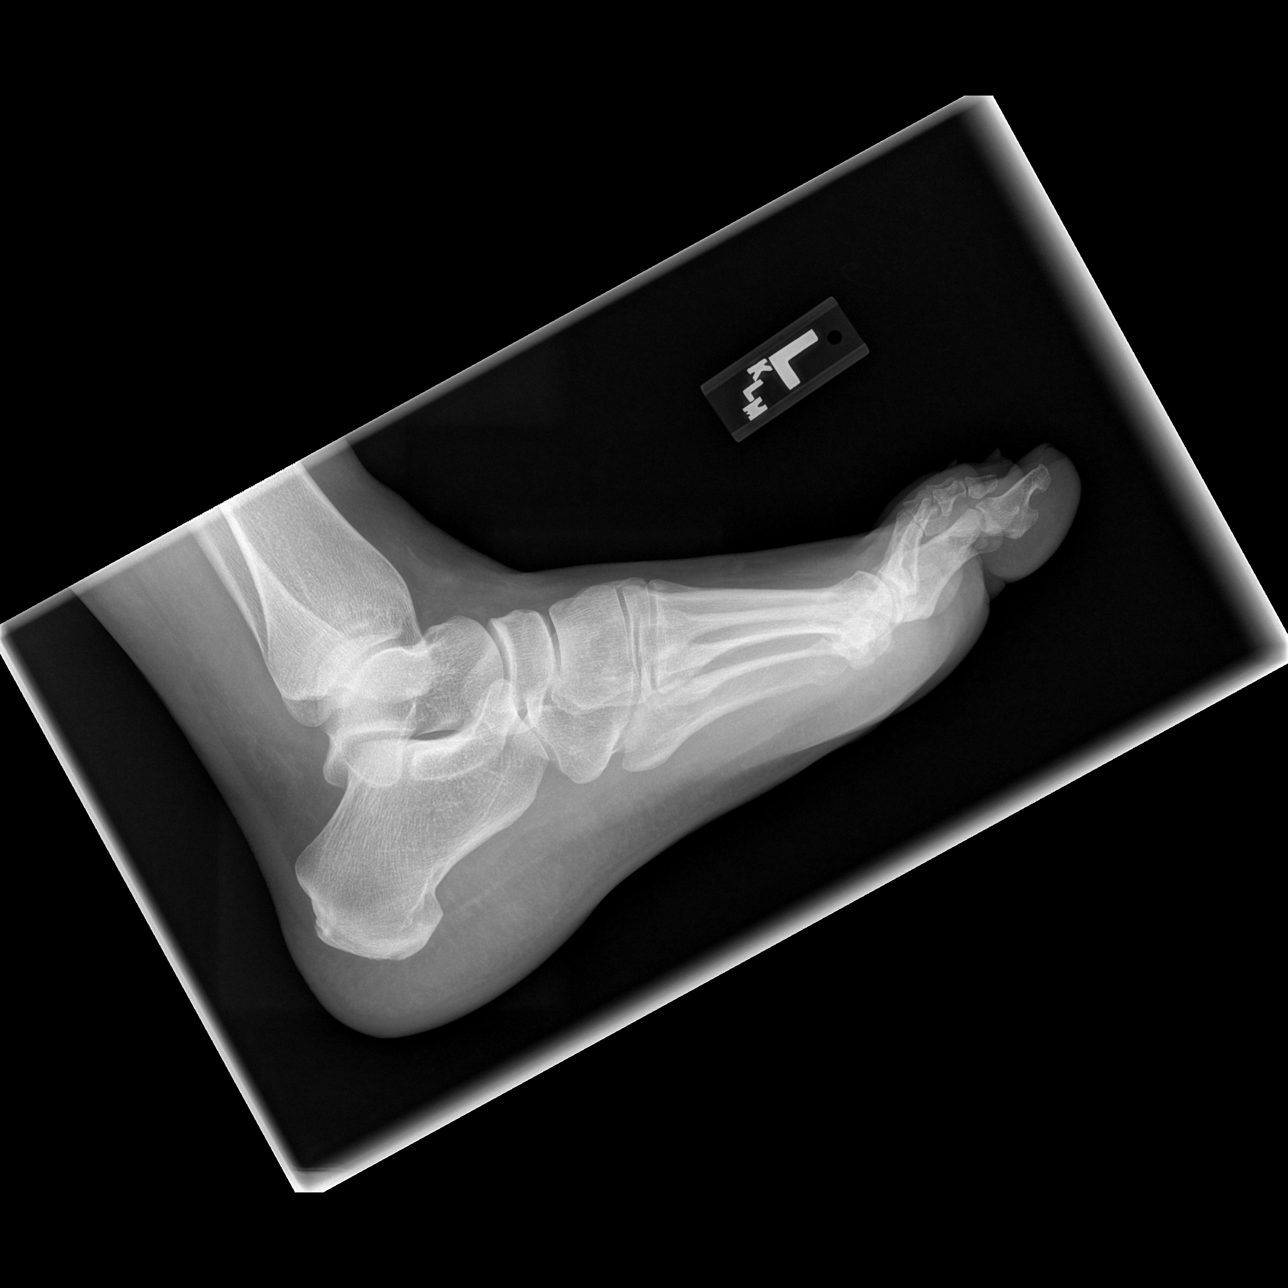

[3 of 3 positions shown; findings below may reference images not displayed]

FINDINGS: Hallux valgus and metatarsus primus varus. Mild 1st MTP joint space
loss, subchondral sclerosis, and osteophytosis.

Alignment elsewhere within normal limits. Bone mineralization is
within normal limits. Calcaneus intact. Other joint spaces appear
maintained.
IMPRESSION: Hallux valgus and metatarsus primus varus with some 1st MTP joint
degeneration.

## 2014-08-07 ENCOUNTER — Other Ambulatory Visit (HOSPITAL_COMMUNITY)
Admission: RE | Admit: 2014-08-07 | Discharge: 2014-08-07 | Disposition: A | Discharge status: Home / Self Care | Payer: BLUE CROSS/BLUE SHIELD | Source: Ambulatory Visit | Attending: Internal Medicine | Admitting: Internal Medicine

## 2014-08-07 DIAGNOSIS — Z01419 Encounter for gynecological examination (general) (routine) without abnormal findings: Secondary | ICD-10-CM | POA: Diagnosis not present

## 2014-08-08 ENCOUNTER — Other Ambulatory Visit: Payer: Self-pay | Admitting: Internal Medicine

## 2014-08-08 DIAGNOSIS — N920 Excessive and frequent menstruation with regular cycle: Secondary | ICD-10-CM

## 2014-08-28 ENCOUNTER — Ambulatory Visit
Admission: RE | Admit: 2014-08-28 | Discharge: 2014-08-28 | Disposition: A | Discharge status: Home / Self Care | Payer: BLUE CROSS/BLUE SHIELD | Source: Ambulatory Visit | Attending: Internal Medicine | Admitting: Internal Medicine

## 2014-08-28 DIAGNOSIS — N920 Excessive and frequent menstruation with regular cycle: Secondary | ICD-10-CM

## 2015-06-12 ENCOUNTER — Other Ambulatory Visit: Payer: Self-pay | Admitting: Internal Medicine

## 2015-06-12 DIAGNOSIS — Z139 Encounter for screening, unspecified: Secondary | ICD-10-CM

## 2015-06-27 ENCOUNTER — Ambulatory Visit
Admission: RE | Admit: 2015-06-27 | Discharge: 2015-06-27 | Disposition: A | Discharge status: Home / Self Care | Payer: BLUE CROSS/BLUE SHIELD | Source: Ambulatory Visit | Attending: Internal Medicine | Admitting: Internal Medicine

## 2015-06-27 DIAGNOSIS — Z139 Encounter for screening, unspecified: Secondary | ICD-10-CM

## 2015-07-03 ENCOUNTER — Other Ambulatory Visit: Payer: Self-pay | Admitting: Internal Medicine

## 2015-07-03 DIAGNOSIS — N63 Unspecified lump in unspecified breast: Secondary | ICD-10-CM

## 2015-08-13 DIAGNOSIS — N649 Disorder of breast, unspecified: Secondary | ICD-10-CM | POA: Diagnosis not present

## 2015-08-13 DIAGNOSIS — Z3040 Encounter for surveillance of contraceptives, unspecified: Secondary | ICD-10-CM | POA: Diagnosis not present

## 2015-08-16 ENCOUNTER — Other Ambulatory Visit: Payer: Self-pay | Admitting: Obstetrics and Gynecology

## 2015-08-16 DIAGNOSIS — R234 Changes in skin texture: Secondary | ICD-10-CM

## 2015-08-16 DIAGNOSIS — R5381 Other malaise: Secondary | ICD-10-CM

## 2015-08-28 ENCOUNTER — Ambulatory Visit
Admission: RE | Admit: 2015-08-28 | Discharge: 2015-08-28 | Disposition: A | Discharge status: Home / Self Care | Payer: BLUE CROSS/BLUE SHIELD | Source: Ambulatory Visit | Attending: Obstetrics and Gynecology | Admitting: Obstetrics and Gynecology

## 2015-08-28 DIAGNOSIS — R922 Inconclusive mammogram: Secondary | ICD-10-CM | POA: Diagnosis not present

## 2015-08-28 DIAGNOSIS — N6011 Diffuse cystic mastopathy of right breast: Secondary | ICD-10-CM | POA: Diagnosis not present

## 2015-08-28 DIAGNOSIS — R234 Changes in skin texture: Secondary | ICD-10-CM

## 2015-08-28 DIAGNOSIS — N6012 Diffuse cystic mastopathy of left breast: Secondary | ICD-10-CM | POA: Diagnosis not present

## 2015-09-17 DIAGNOSIS — M0589 Other rheumatoid arthritis with rheumatoid factor of multiple sites: Secondary | ICD-10-CM | POA: Diagnosis not present

## 2015-09-30 IMAGING — US US PELVIS COMPLETE
1 series · 13 of 25 positions shown · non-contrast
Comparison: None

CLINICAL DATA: Menorrhagia, irregular menstrual cycles.



[Series 1: us pelvis complete · 0.21mm/px · 13 of 56 slices shown]
[im 1/56]
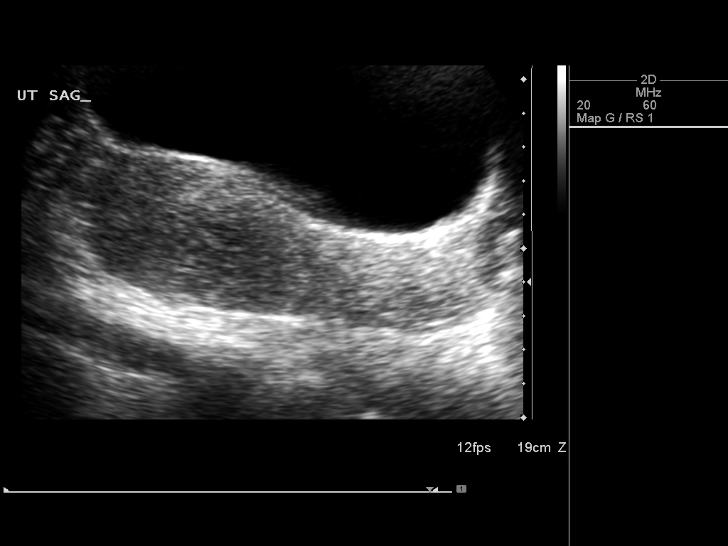
[im 5/56]
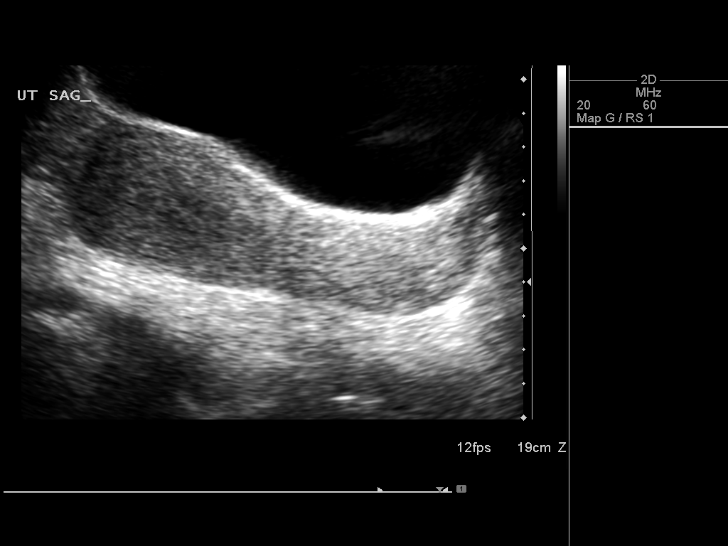
[im 10/56]
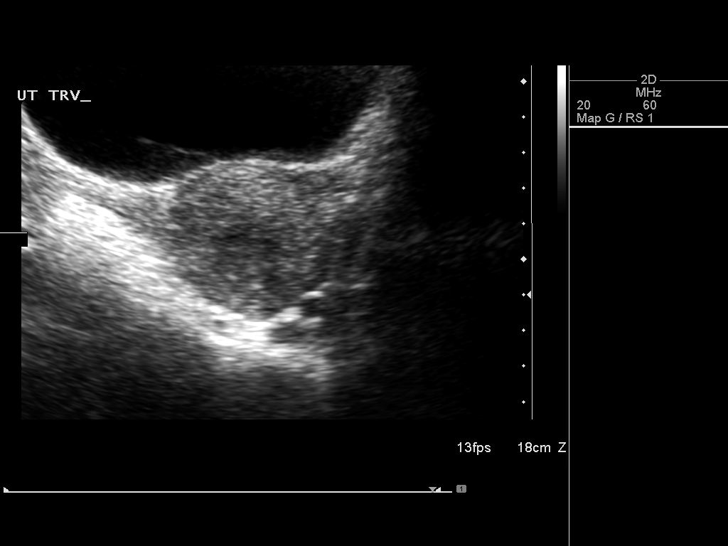
[im 14/56]
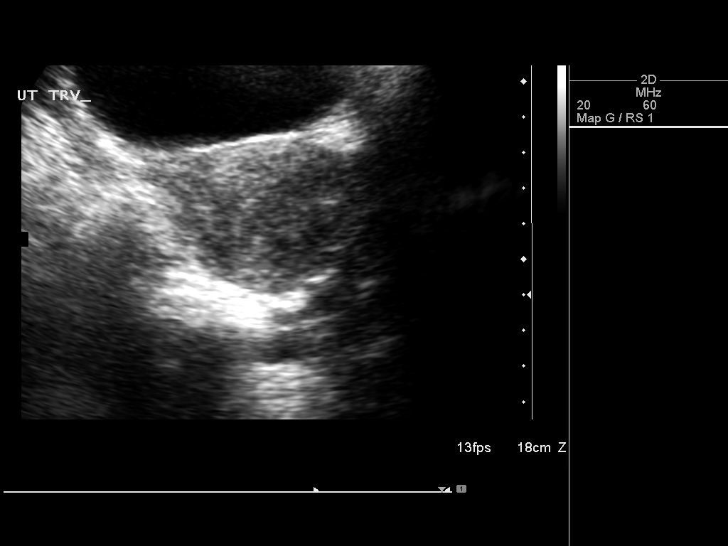
[im 19/56]
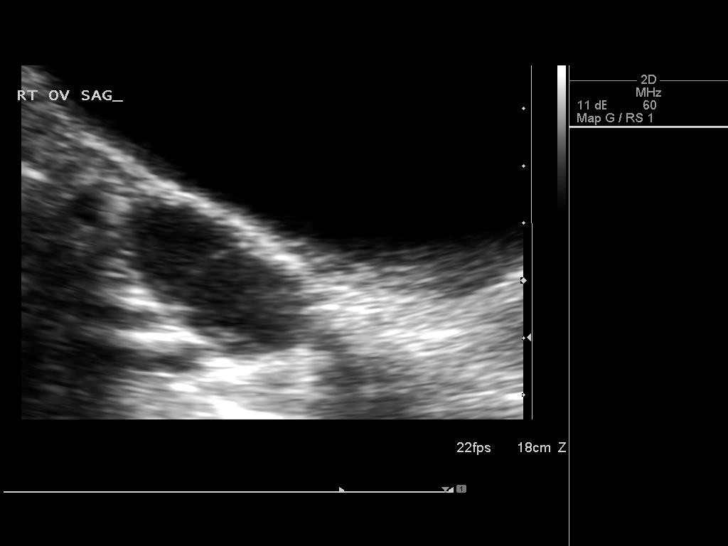
[im 23/56]
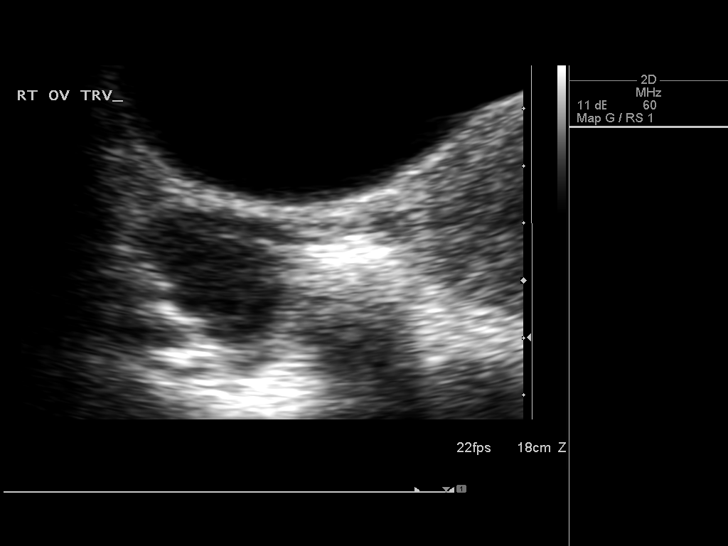
[im 28/56]
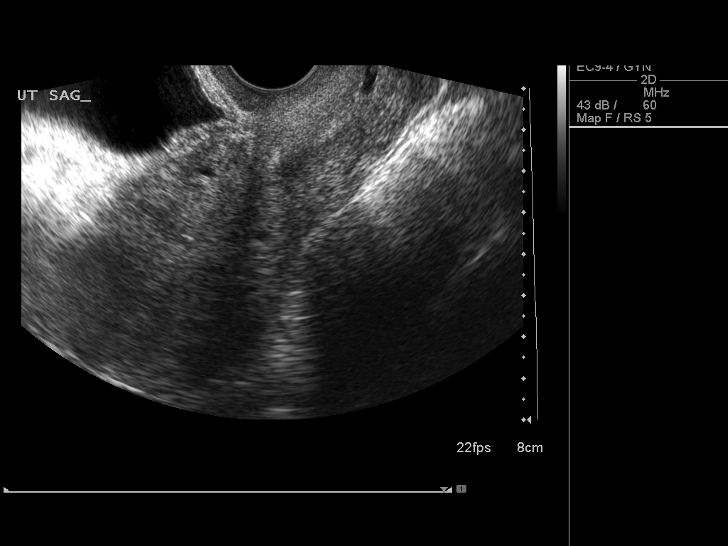
[im 33/56]
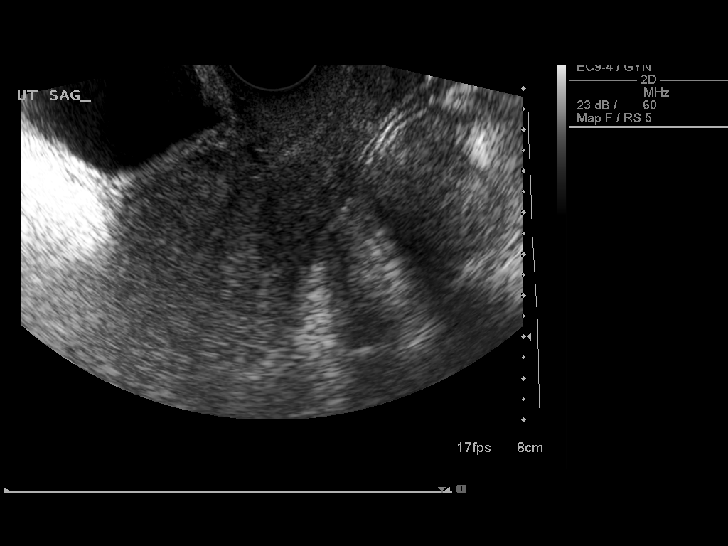
[im 37/56]
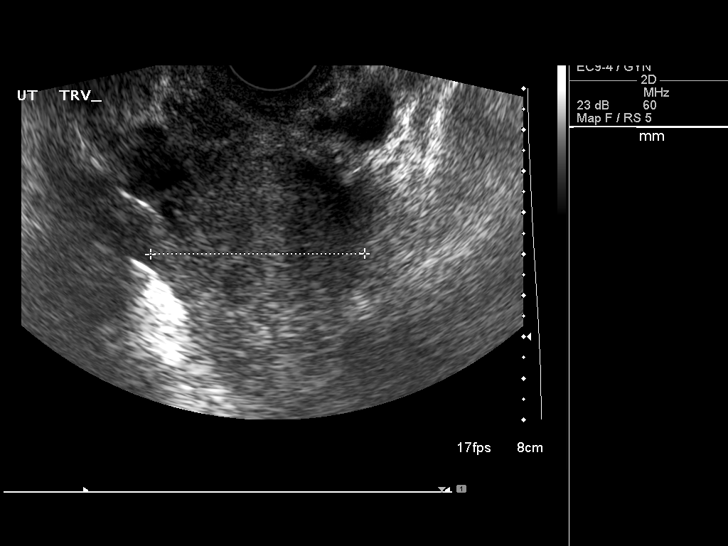
[im 42/56]
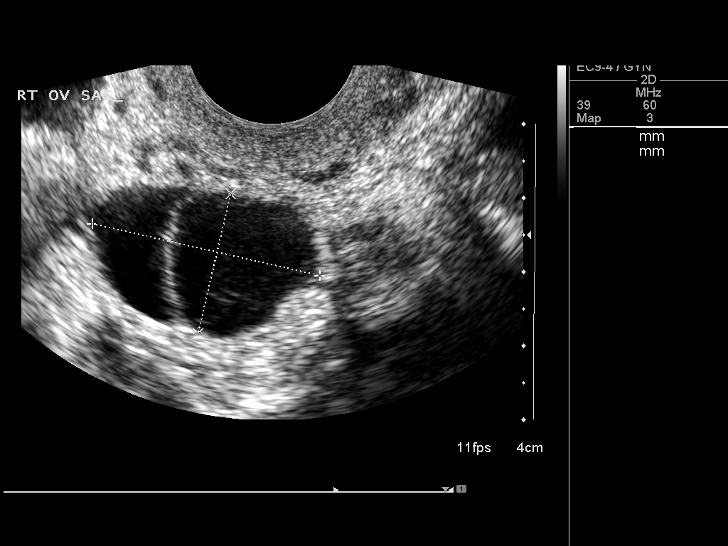
[im 46/56]
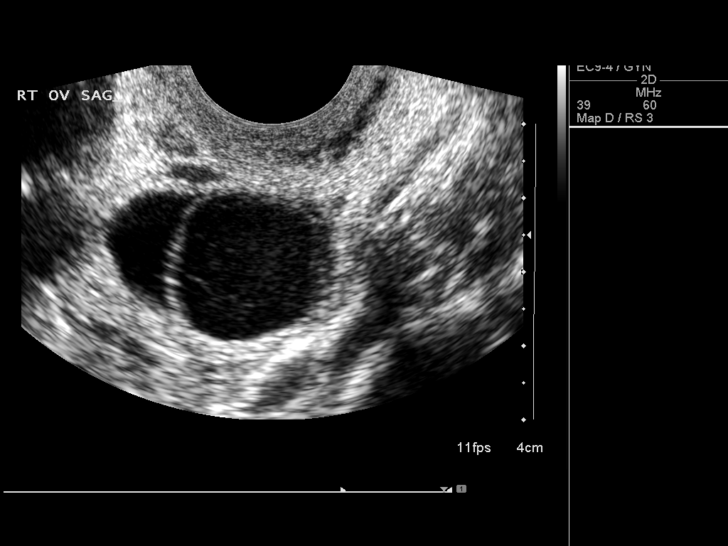
[im 51/56]
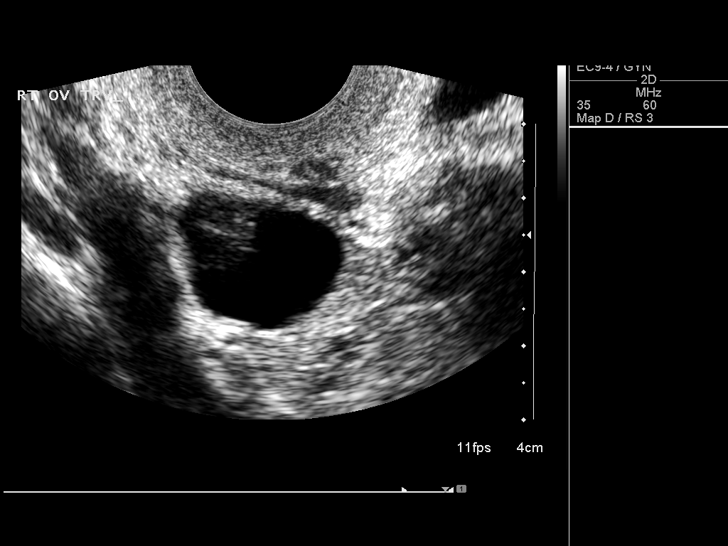
[im 56/56]
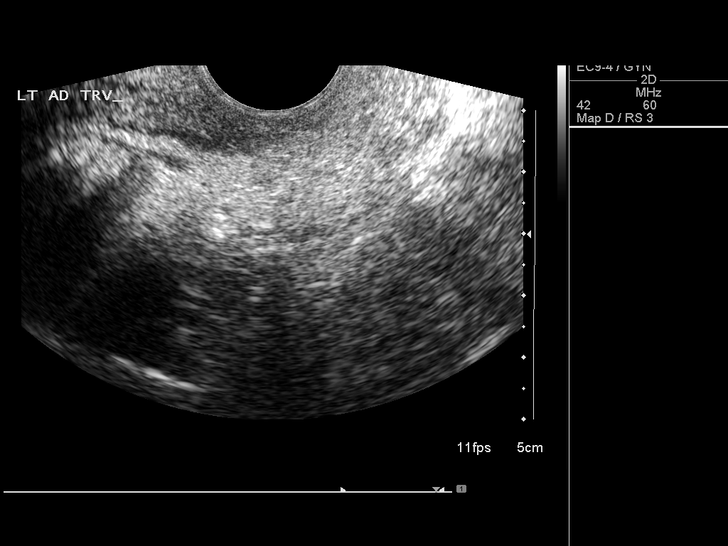

[13 of 25 positions shown; findings below may reference images not displayed]

FINDINGS: Uterus

Measurements: 12.2 x 4.0 x 4.8 cm.. No fibroids or other mass
visualized.

Endometrium

Thickness: 4.9 mm.  No focal abnormality visualized.

Right ovary

Measurements: 3.2 x 1.9 x 2.3 cm. In the right ovary there is a
septated hypoechoic structure with internal echoes which measures
2.9 x 2.0 x 2 cm.

Left ovary

Measurements: The left ovary was not visualized. No left adnexal
masses demonstrated.. New

Other findings

No free fluid.
IMPRESSION: 1. The uterus exhibits no evidence of fibroids. The endometrium is
unremarkable. If bleeding remains unresponsive to hormonal or
medical therapy, sonohysterogram should be considered for focal
lesion work-up. (Ref: Radiological Reasoning: Algorithmic Workup of
Abnormal Vaginal Bleeding with Endovaginal Sonography and
Sonohysterography. AJR [id number]; 191:S68-73)
2. There is a septated hypoechoic structure in the right ovary that
likely reflects a hemorrhagic cyst. Given its size less than 5 cm no
follow-up is required. If there are right adnexal symptoms however,
6-12 weight ultrasonic follow-up is recommended.
3. Nonvisualization of the left ovary.

## 2015-10-31 DIAGNOSIS — R42 Dizziness and giddiness: Secondary | ICD-10-CM | POA: Diagnosis not present

## 2015-10-31 DIAGNOSIS — R030 Elevated blood-pressure reading, without diagnosis of hypertension: Secondary | ICD-10-CM | POA: Diagnosis not present

## 2015-10-31 DIAGNOSIS — R5383 Other fatigue: Secondary | ICD-10-CM | POA: Diagnosis not present

## 2015-11-07 DIAGNOSIS — Z124 Encounter for screening for malignant neoplasm of cervix: Secondary | ICD-10-CM | POA: Diagnosis not present

## 2015-11-07 DIAGNOSIS — Z6826 Body mass index (BMI) 26.0-26.9, adult: Secondary | ICD-10-CM | POA: Diagnosis not present

## 2015-11-07 DIAGNOSIS — Z01419 Encounter for gynecological examination (general) (routine) without abnormal findings: Secondary | ICD-10-CM | POA: Diagnosis not present

## 2015-11-13 DIAGNOSIS — M0589 Other rheumatoid arthritis with rheumatoid factor of multiple sites: Secondary | ICD-10-CM | POA: Diagnosis not present

## 2015-11-20 DIAGNOSIS — M0579 Rheumatoid arthritis with rheumatoid factor of multiple sites without organ or systems involvement: Secondary | ICD-10-CM | POA: Diagnosis not present

## 2015-11-20 DIAGNOSIS — Z79899 Other long term (current) drug therapy: Secondary | ICD-10-CM | POA: Diagnosis not present

## 2015-11-20 DIAGNOSIS — M7989 Other specified soft tissue disorders: Secondary | ICD-10-CM | POA: Diagnosis not present

## 2015-12-05 DIAGNOSIS — R5383 Other fatigue: Secondary | ICD-10-CM | POA: Diagnosis not present

## 2015-12-31 DIAGNOSIS — R030 Elevated blood-pressure reading, without diagnosis of hypertension: Secondary | ICD-10-CM | POA: Diagnosis not present

## 2016-01-08 DIAGNOSIS — M0589 Other rheumatoid arthritis with rheumatoid factor of multiple sites: Secondary | ICD-10-CM | POA: Diagnosis not present

## 2016-01-09 DIAGNOSIS — Z79899 Other long term (current) drug therapy: Secondary | ICD-10-CM | POA: Diagnosis not present

## 2016-03-05 DIAGNOSIS — Z79899 Other long term (current) drug therapy: Secondary | ICD-10-CM | POA: Diagnosis not present

## 2016-03-05 DIAGNOSIS — M0579 Rheumatoid arthritis with rheumatoid factor of multiple sites without organ or systems involvement: Secondary | ICD-10-CM | POA: Diagnosis not present

## 2016-03-05 DIAGNOSIS — Z23 Encounter for immunization: Secondary | ICD-10-CM | POA: Diagnosis not present

## 2016-03-05 DIAGNOSIS — M7989 Other specified soft tissue disorders: Secondary | ICD-10-CM | POA: Diagnosis not present

## 2016-03-10 DIAGNOSIS — M0589 Other rheumatoid arthritis with rheumatoid factor of multiple sites: Secondary | ICD-10-CM | POA: Diagnosis not present

## 2016-03-10 DIAGNOSIS — Z79899 Other long term (current) drug therapy: Secondary | ICD-10-CM | POA: Diagnosis not present

## 2016-05-08 DIAGNOSIS — M0589 Other rheumatoid arthritis with rheumatoid factor of multiple sites: Secondary | ICD-10-CM | POA: Diagnosis not present

## 2016-05-08 DIAGNOSIS — Z79899 Other long term (current) drug therapy: Secondary | ICD-10-CM | POA: Diagnosis not present

## 2016-06-11 DIAGNOSIS — M0579 Rheumatoid arthritis with rheumatoid factor of multiple sites without organ or systems involvement: Secondary | ICD-10-CM | POA: Diagnosis not present

## 2016-06-11 DIAGNOSIS — Z79899 Other long term (current) drug therapy: Secondary | ICD-10-CM | POA: Diagnosis not present

## 2016-07-01 DIAGNOSIS — N760 Acute vaginitis: Secondary | ICD-10-CM | POA: Diagnosis not present

## 2016-07-01 DIAGNOSIS — Z6826 Body mass index (BMI) 26.0-26.9, adult: Secondary | ICD-10-CM | POA: Diagnosis not present

## 2016-07-07 DIAGNOSIS — Z79899 Other long term (current) drug therapy: Secondary | ICD-10-CM | POA: Diagnosis not present

## 2016-07-07 DIAGNOSIS — M0589 Other rheumatoid arthritis with rheumatoid factor of multiple sites: Secondary | ICD-10-CM | POA: Diagnosis not present

## 2016-07-14 DIAGNOSIS — Z0000 Encounter for general adult medical examination without abnormal findings: Secondary | ICD-10-CM | POA: Diagnosis not present

## 2016-07-14 DIAGNOSIS — M0589 Other rheumatoid arthritis with rheumatoid factor of multiple sites: Secondary | ICD-10-CM | POA: Diagnosis not present

## 2016-07-28 DIAGNOSIS — L309 Dermatitis, unspecified: Secondary | ICD-10-CM | POA: Diagnosis not present

## 2016-07-29 DIAGNOSIS — Z131 Encounter for screening for diabetes mellitus: Secondary | ICD-10-CM | POA: Diagnosis not present

## 2016-07-29 DIAGNOSIS — Z13220 Encounter for screening for lipoid disorders: Secondary | ICD-10-CM | POA: Diagnosis not present

## 2016-07-29 DIAGNOSIS — Z0000 Encounter for general adult medical examination without abnormal findings: Secondary | ICD-10-CM | POA: Diagnosis not present

## 2016-08-19 DIAGNOSIS — I8312 Varicose veins of left lower extremity with inflammation: Secondary | ICD-10-CM | POA: Diagnosis not present

## 2016-08-19 DIAGNOSIS — I8311 Varicose veins of right lower extremity with inflammation: Secondary | ICD-10-CM | POA: Diagnosis not present

## 2016-09-04 DIAGNOSIS — M0589 Other rheumatoid arthritis with rheumatoid factor of multiple sites: Secondary | ICD-10-CM | POA: Diagnosis not present

## 2016-09-15 DIAGNOSIS — I8312 Varicose veins of left lower extremity with inflammation: Secondary | ICD-10-CM | POA: Diagnosis not present

## 2016-09-15 DIAGNOSIS — I8311 Varicose veins of right lower extremity with inflammation: Secondary | ICD-10-CM | POA: Diagnosis not present

## 2016-09-30 DIAGNOSIS — I8311 Varicose veins of right lower extremity with inflammation: Secondary | ICD-10-CM | POA: Diagnosis not present

## 2016-09-30 DIAGNOSIS — I8312 Varicose veins of left lower extremity with inflammation: Secondary | ICD-10-CM | POA: Diagnosis not present

## 2016-10-20 DIAGNOSIS — M0579 Rheumatoid arthritis with rheumatoid factor of multiple sites without organ or systems involvement: Secondary | ICD-10-CM | POA: Diagnosis not present

## 2016-10-20 DIAGNOSIS — Z79899 Other long term (current) drug therapy: Secondary | ICD-10-CM | POA: Diagnosis not present

## 2016-10-20 DIAGNOSIS — M7989 Other specified soft tissue disorders: Secondary | ICD-10-CM | POA: Diagnosis not present

## 2016-11-19 DIAGNOSIS — Z79899 Other long term (current) drug therapy: Secondary | ICD-10-CM | POA: Diagnosis not present

## 2016-11-19 DIAGNOSIS — M0589 Other rheumatoid arthritis with rheumatoid factor of multiple sites: Secondary | ICD-10-CM | POA: Diagnosis not present

## 2016-12-08 DIAGNOSIS — Z6825 Body mass index (BMI) 25.0-25.9, adult: Secondary | ICD-10-CM | POA: Diagnosis not present

## 2016-12-08 DIAGNOSIS — Z01419 Encounter for gynecological examination (general) (routine) without abnormal findings: Secondary | ICD-10-CM | POA: Diagnosis not present

## 2016-12-08 DIAGNOSIS — Z124 Encounter for screening for malignant neoplasm of cervix: Secondary | ICD-10-CM | POA: Diagnosis not present

## 2016-12-30 DIAGNOSIS — Z1231 Encounter for screening mammogram for malignant neoplasm of breast: Secondary | ICD-10-CM | POA: Diagnosis not present

## 2017-01-07 DIAGNOSIS — L74513 Primary focal hyperhidrosis, soles: Secondary | ICD-10-CM | POA: Diagnosis not present

## 2017-01-07 DIAGNOSIS — L700 Acne vulgaris: Secondary | ICD-10-CM | POA: Diagnosis not present

## 2017-01-07 DIAGNOSIS — Z23 Encounter for immunization: Secondary | ICD-10-CM | POA: Diagnosis not present

## 2017-01-07 DIAGNOSIS — L309 Dermatitis, unspecified: Secondary | ICD-10-CM | POA: Diagnosis not present

## 2017-01-19 DIAGNOSIS — Z79899 Other long term (current) drug therapy: Secondary | ICD-10-CM | POA: Diagnosis not present

## 2017-01-19 DIAGNOSIS — M0579 Rheumatoid arthritis with rheumatoid factor of multiple sites without organ or systems involvement: Secondary | ICD-10-CM | POA: Diagnosis not present

## 2017-01-20 DIAGNOSIS — M0589 Other rheumatoid arthritis with rheumatoid factor of multiple sites: Secondary | ICD-10-CM | POA: Diagnosis not present

## 2017-01-20 DIAGNOSIS — Z79899 Other long term (current) drug therapy: Secondary | ICD-10-CM | POA: Diagnosis not present

## 2017-02-03 DIAGNOSIS — Z1211 Encounter for screening for malignant neoplasm of colon: Secondary | ICD-10-CM | POA: Diagnosis not present

## 2017-02-03 DIAGNOSIS — K5904 Chronic idiopathic constipation: Secondary | ICD-10-CM | POA: Diagnosis not present

## 2017-02-03 DIAGNOSIS — Z800 Family history of malignant neoplasm of digestive organs: Secondary | ICD-10-CM | POA: Diagnosis not present

## 2017-03-09 DIAGNOSIS — Z1211 Encounter for screening for malignant neoplasm of colon: Secondary | ICD-10-CM | POA: Diagnosis not present

## 2017-03-17 DIAGNOSIS — Z79899 Other long term (current) drug therapy: Secondary | ICD-10-CM | POA: Diagnosis not present

## 2017-03-17 DIAGNOSIS — M0589 Other rheumatoid arthritis with rheumatoid factor of multiple sites: Secondary | ICD-10-CM | POA: Diagnosis not present

## 2017-05-12 DIAGNOSIS — M0589 Other rheumatoid arthritis with rheumatoid factor of multiple sites: Secondary | ICD-10-CM | POA: Diagnosis not present

## 2017-05-12 DIAGNOSIS — Z79899 Other long term (current) drug therapy: Secondary | ICD-10-CM | POA: Diagnosis not present

## 2017-05-18 DIAGNOSIS — Z79899 Other long term (current) drug therapy: Secondary | ICD-10-CM | POA: Diagnosis not present

## 2017-05-18 DIAGNOSIS — M0579 Rheumatoid arthritis with rheumatoid factor of multiple sites without organ or systems involvement: Secondary | ICD-10-CM | POA: Diagnosis not present

## 2017-06-22 DIAGNOSIS — M65312 Trigger thumb, left thumb: Secondary | ICD-10-CM | POA: Diagnosis not present

## 2017-06-22 DIAGNOSIS — M79645 Pain in left finger(s): Secondary | ICD-10-CM | POA: Diagnosis not present

## 2017-07-06 DIAGNOSIS — M0589 Other rheumatoid arthritis with rheumatoid factor of multiple sites: Secondary | ICD-10-CM | POA: Diagnosis not present

## 2017-08-03 DIAGNOSIS — Z0000 Encounter for general adult medical examination without abnormal findings: Secondary | ICD-10-CM | POA: Diagnosis not present

## 2017-08-10 DIAGNOSIS — M79674 Pain in right toe(s): Secondary | ICD-10-CM | POA: Diagnosis not present

## 2017-08-10 DIAGNOSIS — M0589 Other rheumatoid arthritis with rheumatoid factor of multiple sites: Secondary | ICD-10-CM | POA: Diagnosis not present

## 2017-08-10 DIAGNOSIS — Z0000 Encounter for general adult medical examination without abnormal findings: Secondary | ICD-10-CM | POA: Diagnosis not present

## 2017-08-19 DIAGNOSIS — M2042 Other hammer toe(s) (acquired), left foot: Secondary | ICD-10-CM | POA: Diagnosis not present

## 2017-08-19 DIAGNOSIS — M79671 Pain in right foot: Secondary | ICD-10-CM | POA: Diagnosis not present

## 2017-08-19 DIAGNOSIS — M2041 Other hammer toe(s) (acquired), right foot: Secondary | ICD-10-CM | POA: Diagnosis not present

## 2017-08-19 DIAGNOSIS — M79672 Pain in left foot: Secondary | ICD-10-CM | POA: Diagnosis not present

## 2017-08-31 DIAGNOSIS — M0589 Other rheumatoid arthritis with rheumatoid factor of multiple sites: Secondary | ICD-10-CM | POA: Diagnosis not present

## 2017-08-31 DIAGNOSIS — Z79899 Other long term (current) drug therapy: Secondary | ICD-10-CM | POA: Diagnosis not present

## 2017-09-15 DIAGNOSIS — M0579 Rheumatoid arthritis with rheumatoid factor of multiple sites without organ or systems involvement: Secondary | ICD-10-CM | POA: Diagnosis not present

## 2017-09-15 DIAGNOSIS — Z79899 Other long term (current) drug therapy: Secondary | ICD-10-CM | POA: Diagnosis not present

## 2017-09-15 DIAGNOSIS — M24542 Contracture, left hand: Secondary | ICD-10-CM | POA: Diagnosis not present

## 2017-10-26 DIAGNOSIS — Z124 Encounter for screening for malignant neoplasm of cervix: Secondary | ICD-10-CM | POA: Diagnosis not present

## 2017-10-26 DIAGNOSIS — Z01419 Encounter for gynecological examination (general) (routine) without abnormal findings: Secondary | ICD-10-CM | POA: Diagnosis not present

## 2017-10-27 DIAGNOSIS — M0589 Other rheumatoid arthritis with rheumatoid factor of multiple sites: Secondary | ICD-10-CM | POA: Diagnosis not present

## 2017-10-27 DIAGNOSIS — Z79899 Other long term (current) drug therapy: Secondary | ICD-10-CM | POA: Diagnosis not present

## 2017-11-27 DIAGNOSIS — M79671 Pain in right foot: Secondary | ICD-10-CM | POA: Diagnosis not present

## 2017-12-22 DIAGNOSIS — M0589 Other rheumatoid arthritis with rheumatoid factor of multiple sites: Secondary | ICD-10-CM | POA: Diagnosis not present

## 2017-12-22 DIAGNOSIS — Z79899 Other long term (current) drug therapy: Secondary | ICD-10-CM | POA: Diagnosis not present

## 2018-01-25 DIAGNOSIS — M24542 Contracture, left hand: Secondary | ICD-10-CM | POA: Diagnosis not present

## 2018-01-25 DIAGNOSIS — S6992XA Unspecified injury of left wrist, hand and finger(s), initial encounter: Secondary | ICD-10-CM | POA: Diagnosis not present

## 2018-01-25 DIAGNOSIS — M0579 Rheumatoid arthritis with rheumatoid factor of multiple sites without organ or systems involvement: Secondary | ICD-10-CM | POA: Diagnosis not present

## 2018-01-25 DIAGNOSIS — Z23 Encounter for immunization: Secondary | ICD-10-CM | POA: Diagnosis not present

## 2018-02-18 DIAGNOSIS — M0589 Other rheumatoid arthritis with rheumatoid factor of multiple sites: Secondary | ICD-10-CM | POA: Diagnosis not present

## 2018-04-16 DIAGNOSIS — Z79899 Other long term (current) drug therapy: Secondary | ICD-10-CM | POA: Diagnosis not present

## 2018-04-16 DIAGNOSIS — M0589 Other rheumatoid arthritis with rheumatoid factor of multiple sites: Secondary | ICD-10-CM | POA: Diagnosis not present

## 2018-04-21 DIAGNOSIS — I8312 Varicose veins of left lower extremity with inflammation: Secondary | ICD-10-CM | POA: Diagnosis not present

## 2018-04-21 DIAGNOSIS — I8311 Varicose veins of right lower extremity with inflammation: Secondary | ICD-10-CM | POA: Diagnosis not present

## 2018-06-01 DIAGNOSIS — I8312 Varicose veins of left lower extremity with inflammation: Secondary | ICD-10-CM | POA: Diagnosis not present

## 2018-06-16 DIAGNOSIS — M0589 Other rheumatoid arthritis with rheumatoid factor of multiple sites: Secondary | ICD-10-CM | POA: Diagnosis not present

## 2018-07-16 DIAGNOSIS — R5383 Other fatigue: Secondary | ICD-10-CM | POA: Diagnosis not present

## 2018-07-16 DIAGNOSIS — M0579 Rheumatoid arthritis with rheumatoid factor of multiple sites without organ or systems involvement: Secondary | ICD-10-CM | POA: Diagnosis not present

## 2018-08-11 DIAGNOSIS — M0579 Rheumatoid arthritis with rheumatoid factor of multiple sites without organ or systems involvement: Secondary | ICD-10-CM | POA: Diagnosis not present

## 2018-08-11 DIAGNOSIS — Z79899 Other long term (current) drug therapy: Secondary | ICD-10-CM | POA: Diagnosis not present

## 2018-10-13 DIAGNOSIS — M0589 Other rheumatoid arthritis with rheumatoid factor of multiple sites: Secondary | ICD-10-CM | POA: Diagnosis not present

## 2018-10-13 DIAGNOSIS — Z79899 Other long term (current) drug therapy: Secondary | ICD-10-CM | POA: Diagnosis not present

## 2018-10-19 DIAGNOSIS — Z0000 Encounter for general adult medical examination without abnormal findings: Secondary | ICD-10-CM | POA: Diagnosis not present

## 2018-11-09 DIAGNOSIS — Z7189 Other specified counseling: Secondary | ICD-10-CM | POA: Diagnosis not present

## 2018-11-09 DIAGNOSIS — M0589 Other rheumatoid arthritis with rheumatoid factor of multiple sites: Secondary | ICD-10-CM | POA: Diagnosis not present

## 2018-11-09 DIAGNOSIS — R3915 Urgency of urination: Secondary | ICD-10-CM | POA: Diagnosis not present

## 2018-11-09 DIAGNOSIS — Z0000 Encounter for general adult medical examination without abnormal findings: Secondary | ICD-10-CM | POA: Diagnosis not present

## 2018-12-08 DIAGNOSIS — Z79899 Other long term (current) drug therapy: Secondary | ICD-10-CM | POA: Diagnosis not present

## 2018-12-08 DIAGNOSIS — M0589 Other rheumatoid arthritis with rheumatoid factor of multiple sites: Secondary | ICD-10-CM | POA: Diagnosis not present

## 2019-01-19 DIAGNOSIS — Z20828 Contact with and (suspected) exposure to other viral communicable diseases: Secondary | ICD-10-CM | POA: Diagnosis not present

## 2019-01-19 DIAGNOSIS — Z6826 Body mass index (BMI) 26.0-26.9, adult: Secondary | ICD-10-CM | POA: Diagnosis not present

## 2019-02-02 DIAGNOSIS — Z79899 Other long term (current) drug therapy: Secondary | ICD-10-CM | POA: Diagnosis not present

## 2019-02-02 DIAGNOSIS — M0589 Other rheumatoid arthritis with rheumatoid factor of multiple sites: Secondary | ICD-10-CM | POA: Diagnosis not present

## 2019-02-09 DIAGNOSIS — M0579 Rheumatoid arthritis with rheumatoid factor of multiple sites without organ or systems involvement: Secondary | ICD-10-CM | POA: Diagnosis not present

## 2019-02-09 DIAGNOSIS — M24542 Contracture, left hand: Secondary | ICD-10-CM | POA: Diagnosis not present

## 2019-02-09 DIAGNOSIS — R5383 Other fatigue: Secondary | ICD-10-CM | POA: Diagnosis not present

## 2019-02-09 DIAGNOSIS — Z23 Encounter for immunization: Secondary | ICD-10-CM | POA: Diagnosis not present

## 2019-02-09 DIAGNOSIS — Z79899 Other long term (current) drug therapy: Secondary | ICD-10-CM | POA: Diagnosis not present

## 2019-03-28 DIAGNOSIS — Z6827 Body mass index (BMI) 27.0-27.9, adult: Secondary | ICD-10-CM | POA: Diagnosis not present

## 2019-03-28 DIAGNOSIS — Z20828 Contact with and (suspected) exposure to other viral communicable diseases: Secondary | ICD-10-CM | POA: Diagnosis not present

## 2019-03-30 DIAGNOSIS — Z6827 Body mass index (BMI) 27.0-27.9, adult: Secondary | ICD-10-CM | POA: Diagnosis not present

## 2019-03-30 DIAGNOSIS — Z20828 Contact with and (suspected) exposure to other viral communicable diseases: Secondary | ICD-10-CM | POA: Diagnosis not present

## 2019-03-30 DIAGNOSIS — Z79899 Other long term (current) drug therapy: Secondary | ICD-10-CM | POA: Diagnosis not present

## 2019-03-30 DIAGNOSIS — M0589 Other rheumatoid arthritis with rheumatoid factor of multiple sites: Secondary | ICD-10-CM | POA: Diagnosis not present

## 2019-04-15 DIAGNOSIS — Z6827 Body mass index (BMI) 27.0-27.9, adult: Secondary | ICD-10-CM | POA: Diagnosis not present

## 2019-04-15 DIAGNOSIS — Z209 Contact with and (suspected) exposure to unspecified communicable disease: Secondary | ICD-10-CM | POA: Diagnosis not present

## 2019-05-10 DIAGNOSIS — Z6827 Body mass index (BMI) 27.0-27.9, adult: Secondary | ICD-10-CM | POA: Diagnosis not present

## 2019-05-10 DIAGNOSIS — Z20828 Contact with and (suspected) exposure to other viral communicable diseases: Secondary | ICD-10-CM | POA: Diagnosis not present

## 2019-05-31 DIAGNOSIS — M0589 Other rheumatoid arthritis with rheumatoid factor of multiple sites: Secondary | ICD-10-CM | POA: Diagnosis not present

## 2019-07-26 DIAGNOSIS — M0589 Other rheumatoid arthritis with rheumatoid factor of multiple sites: Secondary | ICD-10-CM | POA: Diagnosis not present

## 2019-08-10 DIAGNOSIS — M0579 Rheumatoid arthritis with rheumatoid factor of multiple sites without organ or systems involvement: Secondary | ICD-10-CM | POA: Diagnosis not present

## 2019-08-10 DIAGNOSIS — M24542 Contracture, left hand: Secondary | ICD-10-CM | POA: Diagnosis not present

## 2019-08-15 DIAGNOSIS — M0579 Rheumatoid arthritis with rheumatoid factor of multiple sites without organ or systems involvement: Secondary | ICD-10-CM | POA: Diagnosis not present

## 2019-08-23 DIAGNOSIS — Z20822 Contact with and (suspected) exposure to covid-19: Secondary | ICD-10-CM | POA: Diagnosis not present

## 2019-09-21 DIAGNOSIS — M0579 Rheumatoid arthritis with rheumatoid factor of multiple sites without organ or systems involvement: Secondary | ICD-10-CM | POA: Diagnosis not present

## 2019-09-21 DIAGNOSIS — Z79899 Other long term (current) drug therapy: Secondary | ICD-10-CM | POA: Diagnosis not present

## 2019-09-26 DIAGNOSIS — R5383 Other fatigue: Secondary | ICD-10-CM | POA: Diagnosis not present

## 2019-10-04 DIAGNOSIS — R432 Parageusia: Secondary | ICD-10-CM | POA: Diagnosis not present

## 2019-10-04 DIAGNOSIS — R0981 Nasal congestion: Secondary | ICD-10-CM | POA: Diagnosis not present

## 2019-10-04 DIAGNOSIS — R430 Anosmia: Secondary | ICD-10-CM | POA: Diagnosis not present

## 2019-11-22 DIAGNOSIS — Z79899 Other long term (current) drug therapy: Secondary | ICD-10-CM | POA: Diagnosis not present

## 2019-11-22 DIAGNOSIS — M0579 Rheumatoid arthritis with rheumatoid factor of multiple sites without organ or systems involvement: Secondary | ICD-10-CM | POA: Diagnosis not present

## 2020-01-11 DIAGNOSIS — Z0000 Encounter for general adult medical examination without abnormal findings: Secondary | ICD-10-CM | POA: Diagnosis not present

## 2020-01-11 DIAGNOSIS — Z131 Encounter for screening for diabetes mellitus: Secondary | ICD-10-CM | POA: Diagnosis not present

## 2020-01-11 DIAGNOSIS — E875 Hyperkalemia: Secondary | ICD-10-CM | POA: Diagnosis not present

## 2020-01-13 DIAGNOSIS — E875 Hyperkalemia: Secondary | ICD-10-CM | POA: Diagnosis not present

## 2020-01-16 DIAGNOSIS — Z20828 Contact with and (suspected) exposure to other viral communicable diseases: Secondary | ICD-10-CM | POA: Diagnosis not present

## 2020-01-17 DIAGNOSIS — M0579 Rheumatoid arthritis with rheumatoid factor of multiple sites without organ or systems involvement: Secondary | ICD-10-CM | POA: Diagnosis not present

## 2020-01-17 DIAGNOSIS — Z79899 Other long term (current) drug therapy: Secondary | ICD-10-CM | POA: Diagnosis not present

## 2020-01-18 DIAGNOSIS — I872 Venous insufficiency (chronic) (peripheral): Secondary | ICD-10-CM | POA: Diagnosis not present

## 2020-01-18 DIAGNOSIS — M0589 Other rheumatoid arthritis with rheumatoid factor of multiple sites: Secondary | ICD-10-CM | POA: Diagnosis not present

## 2020-01-18 DIAGNOSIS — R430 Anosmia: Secondary | ICD-10-CM | POA: Diagnosis not present

## 2020-01-18 DIAGNOSIS — M7072 Other bursitis of hip, left hip: Secondary | ICD-10-CM | POA: Diagnosis not present

## 2020-01-18 DIAGNOSIS — Z0000 Encounter for general adult medical examination without abnormal findings: Secondary | ICD-10-CM | POA: Diagnosis not present

## 2020-02-06 DIAGNOSIS — R430 Anosmia: Secondary | ICD-10-CM | POA: Diagnosis not present

## 2020-02-06 DIAGNOSIS — R432 Parageusia: Secondary | ICD-10-CM | POA: Diagnosis not present

## 2020-02-10 ENCOUNTER — Other Ambulatory Visit: Payer: Self-pay | Admitting: Physician Assistant

## 2020-02-10 DIAGNOSIS — R430 Anosmia: Secondary | ICD-10-CM

## 2020-02-13 DIAGNOSIS — Z79899 Other long term (current) drug therapy: Secondary | ICD-10-CM | POA: Diagnosis not present

## 2020-02-13 DIAGNOSIS — M0579 Rheumatoid arthritis with rheumatoid factor of multiple sites without organ or systems involvement: Secondary | ICD-10-CM | POA: Diagnosis not present

## 2020-02-13 DIAGNOSIS — R5383 Other fatigue: Secondary | ICD-10-CM | POA: Diagnosis not present

## 2020-02-13 DIAGNOSIS — M24542 Contracture, left hand: Secondary | ICD-10-CM | POA: Diagnosis not present

## 2020-03-02 ENCOUNTER — Other Ambulatory Visit: Payer: Self-pay

## 2020-03-14 DIAGNOSIS — M0589 Other rheumatoid arthritis with rheumatoid factor of multiple sites: Secondary | ICD-10-CM | POA: Diagnosis not present

## 2020-05-11 DIAGNOSIS — M0589 Other rheumatoid arthritis with rheumatoid factor of multiple sites: Secondary | ICD-10-CM | POA: Diagnosis not present

## 2020-05-28 DIAGNOSIS — T7840XA Allergy, unspecified, initial encounter: Secondary | ICD-10-CM | POA: Diagnosis not present

## 2020-06-11 DIAGNOSIS — R200 Anesthesia of skin: Secondary | ICD-10-CM | POA: Diagnosis not present

## 2020-06-11 DIAGNOSIS — I872 Venous insufficiency (chronic) (peripheral): Secondary | ICD-10-CM | POA: Diagnosis not present

## 2020-06-11 DIAGNOSIS — B353 Tinea pedis: Secondary | ICD-10-CM | POA: Diagnosis not present

## 2020-06-11 DIAGNOSIS — M06252 Rheumatoid bursitis, left hip: Secondary | ICD-10-CM | POA: Diagnosis not present

## 2020-06-19 ENCOUNTER — Other Ambulatory Visit: Payer: Self-pay

## 2020-06-19 ENCOUNTER — Ambulatory Visit
Admission: EM | Admit: 2020-06-19 | Discharge: 2020-06-19 | Disposition: A | Discharge status: Home / Self Care | Payer: BC Managed Care – PPO | Attending: Emergency Medicine | Admitting: Emergency Medicine

## 2020-06-19 ENCOUNTER — Encounter: Payer: Self-pay | Admitting: Emergency Medicine

## 2020-06-19 DIAGNOSIS — S161XXA Strain of muscle, fascia and tendon at neck level, initial encounter: Secondary | ICD-10-CM

## 2020-06-19 HISTORY — DX: Unspecified osteoarthritis, unspecified site: M19.90

## 2020-06-19 MED ORDER — TIZANIDINE HCL 4 MG PO TABS: 2.0000 mg | ORAL_TABLET | Freq: Four times a day (QID) | ORAL | 0 refills | Status: DC | PRN

## 2020-06-19 MED ORDER — NAPROXEN 500 MG PO TABS: 500.0000 mg | ORAL_TABLET | Freq: Two times a day (BID) | ORAL | 0 refills | Status: DC

## 2020-06-25 DIAGNOSIS — M542 Cervicalgia: Secondary | ICD-10-CM | POA: Diagnosis not present

## 2020-06-25 DIAGNOSIS — M5459 Other low back pain: Secondary | ICD-10-CM | POA: Diagnosis not present

## 2020-07-02 ENCOUNTER — Other Ambulatory Visit: Payer: Self-pay | Admitting: Orthopedic Surgery

## 2020-07-02 DIAGNOSIS — M542 Cervicalgia: Secondary | ICD-10-CM

## 2020-07-04 ENCOUNTER — Ambulatory Visit
Admission: RE | Admit: 2020-07-04 | Discharge: 2020-07-04 | Disposition: A | Discharge status: Home / Self Care | Payer: BC Managed Care – PPO | Source: Ambulatory Visit | Attending: Orthopedic Surgery | Admitting: Orthopedic Surgery

## 2020-07-04 DIAGNOSIS — M542 Cervicalgia: Secondary | ICD-10-CM

## 2020-07-06 DIAGNOSIS — M0589 Other rheumatoid arthritis with rheumatoid factor of multiple sites: Secondary | ICD-10-CM | POA: Diagnosis not present

## 2020-07-09 DIAGNOSIS — M542 Cervicalgia: Secondary | ICD-10-CM | POA: Diagnosis not present

## 2020-07-10 DIAGNOSIS — Z20822 Contact with and (suspected) exposure to covid-19: Secondary | ICD-10-CM | POA: Diagnosis not present

## 2020-07-19 DIAGNOSIS — M542 Cervicalgia: Secondary | ICD-10-CM | POA: Diagnosis not present

## 2020-07-25 DIAGNOSIS — M542 Cervicalgia: Secondary | ICD-10-CM | POA: Diagnosis not present

## 2020-08-03 DIAGNOSIS — M542 Cervicalgia: Secondary | ICD-10-CM | POA: Diagnosis not present

## 2020-08-08 DIAGNOSIS — M542 Cervicalgia: Secondary | ICD-10-CM | POA: Diagnosis not present

## 2020-08-15 DIAGNOSIS — M542 Cervicalgia: Secondary | ICD-10-CM | POA: Diagnosis not present

## 2020-08-27 DIAGNOSIS — M542 Cervicalgia: Secondary | ICD-10-CM | POA: Diagnosis not present

## 2020-08-28 DIAGNOSIS — Z20822 Contact with and (suspected) exposure to covid-19: Secondary | ICD-10-CM | POA: Diagnosis not present

## 2020-08-30 DIAGNOSIS — M542 Cervicalgia: Secondary | ICD-10-CM | POA: Diagnosis not present

## 2020-09-03 DIAGNOSIS — M0589 Other rheumatoid arthritis with rheumatoid factor of multiple sites: Secondary | ICD-10-CM | POA: Diagnosis not present

## 2020-09-18 DIAGNOSIS — M0579 Rheumatoid arthritis with rheumatoid factor of multiple sites without organ or systems involvement: Secondary | ICD-10-CM | POA: Diagnosis not present

## 2020-09-18 DIAGNOSIS — Z79899 Other long term (current) drug therapy: Secondary | ICD-10-CM | POA: Diagnosis not present

## 2020-09-18 DIAGNOSIS — M24542 Contracture, left hand: Secondary | ICD-10-CM | POA: Diagnosis not present

## 2020-09-18 DIAGNOSIS — R5383 Other fatigue: Secondary | ICD-10-CM | POA: Diagnosis not present

## 2020-09-21 DIAGNOSIS — Z01419 Encounter for gynecological examination (general) (routine) without abnormal findings: Secondary | ICD-10-CM | POA: Diagnosis not present

## 2020-09-21 DIAGNOSIS — Z01411 Encounter for gynecological examination (general) (routine) with abnormal findings: Secondary | ICD-10-CM | POA: Diagnosis not present

## 2020-09-21 DIAGNOSIS — Z6826 Body mass index (BMI) 26.0-26.9, adult: Secondary | ICD-10-CM | POA: Diagnosis not present

## 2020-09-21 DIAGNOSIS — Z124 Encounter for screening for malignant neoplasm of cervix: Secondary | ICD-10-CM | POA: Diagnosis not present

## 2020-09-21 DIAGNOSIS — Z1231 Encounter for screening mammogram for malignant neoplasm of breast: Secondary | ICD-10-CM | POA: Diagnosis not present

## 2020-10-29 DIAGNOSIS — M0579 Rheumatoid arthritis with rheumatoid factor of multiple sites without organ or systems involvement: Secondary | ICD-10-CM | POA: Diagnosis not present

## 2020-10-29 DIAGNOSIS — Z111 Encounter for screening for respiratory tuberculosis: Secondary | ICD-10-CM | POA: Diagnosis not present

## 2020-10-29 DIAGNOSIS — Z79899 Other long term (current) drug therapy: Secondary | ICD-10-CM | POA: Diagnosis not present

## 2020-10-29 DIAGNOSIS — R5383 Other fatigue: Secondary | ICD-10-CM | POA: Diagnosis not present

## 2020-10-29 DIAGNOSIS — M0589 Other rheumatoid arthritis with rheumatoid factor of multiple sites: Secondary | ICD-10-CM | POA: Diagnosis not present

## 2020-12-24 DIAGNOSIS — M0579 Rheumatoid arthritis with rheumatoid factor of multiple sites without organ or systems involvement: Secondary | ICD-10-CM | POA: Diagnosis not present

## 2020-12-24 DIAGNOSIS — Z79899 Other long term (current) drug therapy: Secondary | ICD-10-CM | POA: Diagnosis not present

## 2021-02-11 DIAGNOSIS — Z0000 Encounter for general adult medical examination without abnormal findings: Secondary | ICD-10-CM | POA: Diagnosis not present

## 2021-02-18 DIAGNOSIS — R21 Rash and other nonspecific skin eruption: Secondary | ICD-10-CM | POA: Diagnosis not present

## 2021-02-18 DIAGNOSIS — D72819 Decreased white blood cell count, unspecified: Secondary | ICD-10-CM | POA: Diagnosis not present

## 2021-02-18 DIAGNOSIS — M0589 Other rheumatoid arthritis with rheumatoid factor of multiple sites: Secondary | ICD-10-CM | POA: Diagnosis not present

## 2021-02-18 DIAGNOSIS — Z0000 Encounter for general adult medical examination without abnormal findings: Secondary | ICD-10-CM | POA: Diagnosis not present

## 2021-02-19 DIAGNOSIS — M0589 Other rheumatoid arthritis with rheumatoid factor of multiple sites: Secondary | ICD-10-CM | POA: Diagnosis not present

## 2021-03-04 DIAGNOSIS — J309 Allergic rhinitis, unspecified: Secondary | ICD-10-CM | POA: Diagnosis not present

## 2021-03-19 DIAGNOSIS — Z79899 Other long term (current) drug therapy: Secondary | ICD-10-CM | POA: Diagnosis not present

## 2021-03-19 DIAGNOSIS — M25552 Pain in left hip: Secondary | ICD-10-CM | POA: Diagnosis not present

## 2021-03-19 DIAGNOSIS — M24542 Contracture, left hand: Secondary | ICD-10-CM | POA: Diagnosis not present

## 2021-03-19 DIAGNOSIS — M0579 Rheumatoid arthritis with rheumatoid factor of multiple sites without organ or systems involvement: Secondary | ICD-10-CM | POA: Diagnosis not present

## 2021-04-10 DIAGNOSIS — R509 Fever, unspecified: Secondary | ICD-10-CM | POA: Diagnosis not present

## 2021-04-11 DIAGNOSIS — Z20822 Contact with and (suspected) exposure to covid-19: Secondary | ICD-10-CM | POA: Diagnosis not present

## 2021-04-17 DIAGNOSIS — Z79899 Other long term (current) drug therapy: Secondary | ICD-10-CM | POA: Diagnosis not present

## 2021-04-17 DIAGNOSIS — M0589 Other rheumatoid arthritis with rheumatoid factor of multiple sites: Secondary | ICD-10-CM | POA: Diagnosis not present

## 2021-04-22 DIAGNOSIS — R350 Frequency of micturition: Secondary | ICD-10-CM | POA: Diagnosis not present

## 2021-06-13 DIAGNOSIS — M0589 Other rheumatoid arthritis with rheumatoid factor of multiple sites: Secondary | ICD-10-CM | POA: Diagnosis not present

## 2021-06-13 DIAGNOSIS — Z79899 Other long term (current) drug therapy: Secondary | ICD-10-CM | POA: Diagnosis not present

## 2021-07-05 DIAGNOSIS — J40 Bronchitis, not specified as acute or chronic: Secondary | ICD-10-CM | POA: Diagnosis not present

## 2021-07-05 DIAGNOSIS — R058 Other specified cough: Secondary | ICD-10-CM | POA: Diagnosis not present

## 2021-08-01 DIAGNOSIS — N952 Postmenopausal atrophic vaginitis: Secondary | ICD-10-CM | POA: Diagnosis not present

## 2021-08-01 DIAGNOSIS — N951 Menopausal and female climacteric states: Secondary | ICD-10-CM | POA: Diagnosis not present

## 2021-08-12 DIAGNOSIS — Z79899 Other long term (current) drug therapy: Secondary | ICD-10-CM | POA: Diagnosis not present

## 2021-08-12 DIAGNOSIS — M0589 Other rheumatoid arthritis with rheumatoid factor of multiple sites: Secondary | ICD-10-CM | POA: Diagnosis not present

## 2021-09-18 DIAGNOSIS — M25552 Pain in left hip: Secondary | ICD-10-CM | POA: Diagnosis not present

## 2021-09-18 DIAGNOSIS — Z79899 Other long term (current) drug therapy: Secondary | ICD-10-CM | POA: Diagnosis not present

## 2021-09-18 DIAGNOSIS — M0579 Rheumatoid arthritis with rheumatoid factor of multiple sites without organ or systems involvement: Secondary | ICD-10-CM | POA: Diagnosis not present

## 2021-09-18 DIAGNOSIS — M24542 Contracture, left hand: Secondary | ICD-10-CM | POA: Diagnosis not present

## 2021-10-22 DIAGNOSIS — Z79899 Other long term (current) drug therapy: Secondary | ICD-10-CM | POA: Diagnosis not present

## 2021-10-22 DIAGNOSIS — M0589 Other rheumatoid arthritis with rheumatoid factor of multiple sites: Secondary | ICD-10-CM | POA: Diagnosis not present

## 2021-10-28 DIAGNOSIS — Z1231 Encounter for screening mammogram for malignant neoplasm of breast: Secondary | ICD-10-CM | POA: Diagnosis not present

## 2021-10-28 DIAGNOSIS — Z6828 Body mass index (BMI) 28.0-28.9, adult: Secondary | ICD-10-CM | POA: Diagnosis not present

## 2021-10-28 DIAGNOSIS — Z01419 Encounter for gynecological examination (general) (routine) without abnormal findings: Secondary | ICD-10-CM | POA: Diagnosis not present

## 2021-11-20 DIAGNOSIS — L910 Hypertrophic scar: Secondary | ICD-10-CM | POA: Diagnosis not present

## 2021-11-20 DIAGNOSIS — B351 Tinea unguium: Secondary | ICD-10-CM | POA: Diagnosis not present

## 2021-11-20 DIAGNOSIS — B353 Tinea pedis: Secondary | ICD-10-CM | POA: Diagnosis not present

## 2022-01-13 DIAGNOSIS — Z79899 Other long term (current) drug therapy: Secondary | ICD-10-CM | POA: Diagnosis not present

## 2022-01-13 DIAGNOSIS — M0589 Other rheumatoid arthritis with rheumatoid factor of multiple sites: Secondary | ICD-10-CM | POA: Diagnosis not present

## 2022-01-13 DIAGNOSIS — R5383 Other fatigue: Secondary | ICD-10-CM | POA: Diagnosis not present

## 2022-01-20 DIAGNOSIS — L810 Postinflammatory hyperpigmentation: Secondary | ICD-10-CM | POA: Diagnosis not present

## 2022-01-20 DIAGNOSIS — L910 Hypertrophic scar: Secondary | ICD-10-CM | POA: Diagnosis not present

## 2022-03-11 DIAGNOSIS — Z79899 Other long term (current) drug therapy: Secondary | ICD-10-CM | POA: Diagnosis not present

## 2022-03-11 DIAGNOSIS — M0589 Other rheumatoid arthritis with rheumatoid factor of multiple sites: Secondary | ICD-10-CM | POA: Diagnosis not present

## 2022-03-18 DIAGNOSIS — M0589 Other rheumatoid arthritis with rheumatoid factor of multiple sites: Secondary | ICD-10-CM | POA: Diagnosis not present

## 2022-03-18 DIAGNOSIS — D72819 Decreased white blood cell count, unspecified: Secondary | ICD-10-CM | POA: Diagnosis not present

## 2022-03-18 DIAGNOSIS — Z0000 Encounter for general adult medical examination without abnormal findings: Secondary | ICD-10-CM | POA: Diagnosis not present

## 2022-03-24 DIAGNOSIS — Z79899 Other long term (current) drug therapy: Secondary | ICD-10-CM | POA: Diagnosis not present

## 2022-03-24 DIAGNOSIS — M24542 Contracture, left hand: Secondary | ICD-10-CM | POA: Diagnosis not present

## 2022-03-24 DIAGNOSIS — M25552 Pain in left hip: Secondary | ICD-10-CM | POA: Diagnosis not present

## 2022-03-24 DIAGNOSIS — M0579 Rheumatoid arthritis with rheumatoid factor of multiple sites without organ or systems involvement: Secondary | ICD-10-CM | POA: Diagnosis not present

## 2022-03-25 DIAGNOSIS — Z0000 Encounter for general adult medical examination without abnormal findings: Secondary | ICD-10-CM | POA: Diagnosis not present

## 2022-03-25 DIAGNOSIS — D72819 Decreased white blood cell count, unspecified: Secondary | ICD-10-CM | POA: Diagnosis not present

## 2022-03-25 DIAGNOSIS — Z23 Encounter for immunization: Secondary | ICD-10-CM | POA: Diagnosis not present

## 2022-03-25 DIAGNOSIS — F419 Anxiety disorder, unspecified: Secondary | ICD-10-CM | POA: Diagnosis not present

## 2022-03-25 DIAGNOSIS — B379 Candidiasis, unspecified: Secondary | ICD-10-CM | POA: Diagnosis not present

## 2022-03-25 DIAGNOSIS — F418 Other specified anxiety disorders: Secondary | ICD-10-CM | POA: Diagnosis not present

## 2022-05-07 DIAGNOSIS — M0589 Other rheumatoid arthritis with rheumatoid factor of multiple sites: Secondary | ICD-10-CM | POA: Diagnosis not present

## 2022-05-12 DIAGNOSIS — F419 Anxiety disorder, unspecified: Secondary | ICD-10-CM | POA: Diagnosis not present

## 2022-06-25 DIAGNOSIS — Z79890 Hormone replacement therapy (postmenopausal): Secondary | ICD-10-CM | POA: Diagnosis not present

## 2022-06-25 DIAGNOSIS — N898 Other specified noninflammatory disorders of vagina: Secondary | ICD-10-CM | POA: Diagnosis not present

## 2022-06-25 DIAGNOSIS — N951 Menopausal and female climacteric states: Secondary | ICD-10-CM | POA: Diagnosis not present

## 2022-06-25 DIAGNOSIS — B3731 Acute candidiasis of vulva and vagina: Secondary | ICD-10-CM | POA: Diagnosis not present

## 2022-07-02 DIAGNOSIS — Z79899 Other long term (current) drug therapy: Secondary | ICD-10-CM | POA: Diagnosis not present

## 2022-07-02 DIAGNOSIS — M0589 Other rheumatoid arthritis with rheumatoid factor of multiple sites: Secondary | ICD-10-CM | POA: Diagnosis not present

## 2022-09-01 DIAGNOSIS — M0589 Other rheumatoid arthritis with rheumatoid factor of multiple sites: Secondary | ICD-10-CM | POA: Diagnosis not present

## 2022-09-01 DIAGNOSIS — Z79899 Other long term (current) drug therapy: Secondary | ICD-10-CM | POA: Diagnosis not present

## 2022-09-02 DIAGNOSIS — L84 Corns and callosities: Secondary | ICD-10-CM | POA: Diagnosis not present

## 2022-09-02 DIAGNOSIS — L810 Postinflammatory hyperpigmentation: Secondary | ICD-10-CM | POA: Diagnosis not present

## 2022-09-02 DIAGNOSIS — L910 Hypertrophic scar: Secondary | ICD-10-CM | POA: Diagnosis not present

## 2022-09-15 DIAGNOSIS — M7070 Other bursitis of hip, unspecified hip: Secondary | ICD-10-CM | POA: Diagnosis not present

## 2022-09-18 DIAGNOSIS — M0579 Rheumatoid arthritis with rheumatoid factor of multiple sites without organ or systems involvement: Secondary | ICD-10-CM | POA: Diagnosis not present

## 2022-09-18 DIAGNOSIS — M25552 Pain in left hip: Secondary | ICD-10-CM | POA: Diagnosis not present

## 2022-10-27 DIAGNOSIS — M0589 Other rheumatoid arthritis with rheumatoid factor of multiple sites: Secondary | ICD-10-CM | POA: Diagnosis not present

## 2022-10-27 DIAGNOSIS — Z79899 Other long term (current) drug therapy: Secondary | ICD-10-CM | POA: Diagnosis not present

## 2022-10-28 DIAGNOSIS — M6281 Muscle weakness (generalized): Secondary | ICD-10-CM | POA: Diagnosis not present

## 2022-10-28 DIAGNOSIS — M25562 Pain in left knee: Secondary | ICD-10-CM | POA: Diagnosis not present

## 2022-10-28 DIAGNOSIS — M25552 Pain in left hip: Secondary | ICD-10-CM | POA: Diagnosis not present

## 2022-10-30 ENCOUNTER — Other Ambulatory Visit: Payer: Self-pay | Admitting: Internal Medicine

## 2022-10-30 ENCOUNTER — Ambulatory Visit
Admission: RE | Admit: 2022-10-30 | Discharge: 2022-10-30 | Disposition: A | Discharge status: Home / Self Care | Payer: BLUE CROSS/BLUE SHIELD | Source: Ambulatory Visit | Attending: Internal Medicine | Admitting: Internal Medicine

## 2022-10-30 DIAGNOSIS — M79609 Pain in unspecified limb: Secondary | ICD-10-CM | POA: Diagnosis not present

## 2022-10-30 DIAGNOSIS — M25562 Pain in left knee: Secondary | ICD-10-CM | POA: Diagnosis not present

## 2022-10-30 DIAGNOSIS — R229 Localized swelling, mass and lump, unspecified: Secondary | ICD-10-CM | POA: Diagnosis not present

## 2022-10-30 DIAGNOSIS — M7122 Synovial cyst of popliteal space [Baker], left knee: Secondary | ICD-10-CM | POA: Diagnosis not present

## 2022-10-30 DIAGNOSIS — M24542 Contracture, left hand: Secondary | ICD-10-CM | POA: Diagnosis not present

## 2022-10-30 DIAGNOSIS — Z79899 Other long term (current) drug therapy: Secondary | ICD-10-CM | POA: Diagnosis not present

## 2022-10-30 DIAGNOSIS — M25552 Pain in left hip: Secondary | ICD-10-CM | POA: Diagnosis not present

## 2022-10-30 DIAGNOSIS — M0579 Rheumatoid arthritis with rheumatoid factor of multiple sites without organ or systems involvement: Secondary | ICD-10-CM | POA: Diagnosis not present

## 2022-10-30 DIAGNOSIS — M7989 Other specified soft tissue disorders: Secondary | ICD-10-CM

## 2022-10-31 DIAGNOSIS — M25562 Pain in left knee: Secondary | ICD-10-CM | POA: Diagnosis not present

## 2022-10-31 DIAGNOSIS — M25552 Pain in left hip: Secondary | ICD-10-CM | POA: Diagnosis not present

## 2022-10-31 DIAGNOSIS — M6281 Muscle weakness (generalized): Secondary | ICD-10-CM | POA: Diagnosis not present

## 2022-11-12 DIAGNOSIS — M6281 Muscle weakness (generalized): Secondary | ICD-10-CM | POA: Diagnosis not present

## 2022-11-12 DIAGNOSIS — M25552 Pain in left hip: Secondary | ICD-10-CM | POA: Diagnosis not present

## 2022-11-12 DIAGNOSIS — M25562 Pain in left knee: Secondary | ICD-10-CM | POA: Diagnosis not present

## 2022-11-14 DIAGNOSIS — M6281 Muscle weakness (generalized): Secondary | ICD-10-CM | POA: Diagnosis not present

## 2022-11-14 DIAGNOSIS — M25552 Pain in left hip: Secondary | ICD-10-CM | POA: Diagnosis not present

## 2022-11-14 DIAGNOSIS — M25562 Pain in left knee: Secondary | ICD-10-CM | POA: Diagnosis not present

## 2022-11-18 DIAGNOSIS — M25552 Pain in left hip: Secondary | ICD-10-CM | POA: Diagnosis not present

## 2022-11-18 DIAGNOSIS — M25562 Pain in left knee: Secondary | ICD-10-CM | POA: Diagnosis not present

## 2022-11-18 DIAGNOSIS — M6281 Muscle weakness (generalized): Secondary | ICD-10-CM | POA: Diagnosis not present

## 2022-11-19 DIAGNOSIS — M24542 Contracture, left hand: Secondary | ICD-10-CM | POA: Diagnosis not present

## 2022-11-19 DIAGNOSIS — M0579 Rheumatoid arthritis with rheumatoid factor of multiple sites without organ or systems involvement: Secondary | ICD-10-CM | POA: Diagnosis not present

## 2022-11-19 DIAGNOSIS — M25562 Pain in left knee: Secondary | ICD-10-CM | POA: Diagnosis not present

## 2022-11-19 DIAGNOSIS — M25552 Pain in left hip: Secondary | ICD-10-CM | POA: Diagnosis not present

## 2022-11-19 DIAGNOSIS — Z79899 Other long term (current) drug therapy: Secondary | ICD-10-CM | POA: Diagnosis not present

## 2022-11-21 DIAGNOSIS — M6281 Muscle weakness (generalized): Secondary | ICD-10-CM | POA: Diagnosis not present

## 2022-11-21 DIAGNOSIS — M25552 Pain in left hip: Secondary | ICD-10-CM | POA: Diagnosis not present

## 2022-11-21 DIAGNOSIS — M25562 Pain in left knee: Secondary | ICD-10-CM | POA: Diagnosis not present

## 2022-11-28 DIAGNOSIS — M25552 Pain in left hip: Secondary | ICD-10-CM | POA: Diagnosis not present

## 2022-11-28 DIAGNOSIS — M25562 Pain in left knee: Secondary | ICD-10-CM | POA: Diagnosis not present

## 2022-11-28 DIAGNOSIS — M6281 Muscle weakness (generalized): Secondary | ICD-10-CM | POA: Diagnosis not present

## 2022-12-02 DIAGNOSIS — M25552 Pain in left hip: Secondary | ICD-10-CM | POA: Diagnosis not present

## 2022-12-02 DIAGNOSIS — M6281 Muscle weakness (generalized): Secondary | ICD-10-CM | POA: Diagnosis not present

## 2022-12-02 DIAGNOSIS — M25562 Pain in left knee: Secondary | ICD-10-CM | POA: Diagnosis not present

## 2022-12-05 DIAGNOSIS — M25552 Pain in left hip: Secondary | ICD-10-CM | POA: Diagnosis not present

## 2022-12-05 DIAGNOSIS — M25562 Pain in left knee: Secondary | ICD-10-CM | POA: Diagnosis not present

## 2022-12-05 DIAGNOSIS — M6281 Muscle weakness (generalized): Secondary | ICD-10-CM | POA: Diagnosis not present

## 2022-12-12 DIAGNOSIS — M25552 Pain in left hip: Secondary | ICD-10-CM | POA: Diagnosis not present

## 2022-12-12 DIAGNOSIS — M6281 Muscle weakness (generalized): Secondary | ICD-10-CM | POA: Diagnosis not present

## 2022-12-12 DIAGNOSIS — M25562 Pain in left knee: Secondary | ICD-10-CM | POA: Diagnosis not present

## 2022-12-29 DIAGNOSIS — M25552 Pain in left hip: Secondary | ICD-10-CM | POA: Diagnosis not present

## 2022-12-29 DIAGNOSIS — R5383 Other fatigue: Secondary | ICD-10-CM | POA: Diagnosis not present

## 2022-12-29 DIAGNOSIS — Z79899 Other long term (current) drug therapy: Secondary | ICD-10-CM | POA: Diagnosis not present

## 2022-12-29 DIAGNOSIS — M0589 Other rheumatoid arthritis with rheumatoid factor of multiple sites: Secondary | ICD-10-CM | POA: Diagnosis not present

## 2022-12-29 DIAGNOSIS — M25562 Pain in left knee: Secondary | ICD-10-CM | POA: Diagnosis not present

## 2022-12-29 DIAGNOSIS — M6281 Muscle weakness (generalized): Secondary | ICD-10-CM | POA: Diagnosis not present

## 2022-12-29 DIAGNOSIS — Z111 Encounter for screening for respiratory tuberculosis: Secondary | ICD-10-CM | POA: Diagnosis not present

## 2023-01-07 DIAGNOSIS — M25511 Pain in right shoulder: Secondary | ICD-10-CM | POA: Diagnosis not present

## 2023-01-07 DIAGNOSIS — M542 Cervicalgia: Secondary | ICD-10-CM | POA: Diagnosis not present

## 2023-01-07 DIAGNOSIS — G8929 Other chronic pain: Secondary | ICD-10-CM | POA: Diagnosis not present

## 2023-01-07 DIAGNOSIS — M25569 Pain in unspecified knee: Secondary | ICD-10-CM | POA: Diagnosis not present

## 2023-01-09 DIAGNOSIS — M25552 Pain in left hip: Secondary | ICD-10-CM | POA: Diagnosis not present

## 2023-01-09 DIAGNOSIS — M6281 Muscle weakness (generalized): Secondary | ICD-10-CM | POA: Diagnosis not present

## 2023-01-09 DIAGNOSIS — M25562 Pain in left knee: Secondary | ICD-10-CM | POA: Diagnosis not present

## 2023-01-14 DIAGNOSIS — M25562 Pain in left knee: Secondary | ICD-10-CM | POA: Diagnosis not present

## 2023-01-14 DIAGNOSIS — M6281 Muscle weakness (generalized): Secondary | ICD-10-CM | POA: Diagnosis not present

## 2023-01-14 DIAGNOSIS — M25552 Pain in left hip: Secondary | ICD-10-CM | POA: Diagnosis not present

## 2023-01-28 DIAGNOSIS — N951 Menopausal and female climacteric states: Secondary | ICD-10-CM | POA: Diagnosis not present

## 2023-01-28 DIAGNOSIS — Z1329 Encounter for screening for other suspected endocrine disorder: Secondary | ICD-10-CM | POA: Diagnosis not present

## 2023-02-05 DIAGNOSIS — N951 Menopausal and female climacteric states: Secondary | ICD-10-CM | POA: Diagnosis not present

## 2023-02-05 DIAGNOSIS — M0520 Rheumatoid vasculitis with rheumatoid arthritis of unspecified site: Secondary | ICD-10-CM | POA: Diagnosis not present

## 2023-02-05 DIAGNOSIS — Z6828 Body mass index (BMI) 28.0-28.9, adult: Secondary | ICD-10-CM | POA: Diagnosis not present

## 2023-02-05 DIAGNOSIS — F32A Depression, unspecified: Secondary | ICD-10-CM | POA: Diagnosis not present

## 2023-02-17 DIAGNOSIS — M24542 Contracture, left hand: Secondary | ICD-10-CM | POA: Diagnosis not present

## 2023-02-17 DIAGNOSIS — Z79899 Other long term (current) drug therapy: Secondary | ICD-10-CM | POA: Diagnosis not present

## 2023-02-17 DIAGNOSIS — M25552 Pain in left hip: Secondary | ICD-10-CM | POA: Diagnosis not present

## 2023-02-17 DIAGNOSIS — M0579 Rheumatoid arthritis with rheumatoid factor of multiple sites without organ or systems involvement: Secondary | ICD-10-CM | POA: Diagnosis not present

## 2023-02-24 DIAGNOSIS — M0589 Other rheumatoid arthritis with rheumatoid factor of multiple sites: Secondary | ICD-10-CM | POA: Diagnosis not present

## 2023-03-05 ENCOUNTER — Ambulatory Visit: Payer: BC Managed Care – PPO | Admitting: Podiatry

## 2023-03-05 ENCOUNTER — Encounter: Payer: Self-pay | Admitting: Podiatry

## 2023-03-05 ENCOUNTER — Ambulatory Visit (INDEPENDENT_AMBULATORY_CARE_PROVIDER_SITE_OTHER): Payer: BC Managed Care – PPO

## 2023-03-05 ENCOUNTER — Telehealth: Payer: Self-pay | Admitting: Podiatry

## 2023-03-05 DIAGNOSIS — M7751 Other enthesopathy of right foot: Secondary | ICD-10-CM

## 2023-03-05 DIAGNOSIS — M21621 Bunionette of right foot: Secondary | ICD-10-CM

## 2023-03-05 MED ORDER — TRIAMCINOLONE ACETONIDE 10 MG/ML IJ SUSP
10.0000 mg | Freq: Once | INTRAMUSCULAR | Status: AC
  Administered 2023-03-05: 10 mg via INTRA_ARTICULAR

## 2023-03-09 DIAGNOSIS — N951 Menopausal and female climacteric states: Secondary | ICD-10-CM | POA: Diagnosis not present

## 2023-03-12 DIAGNOSIS — Z6828 Body mass index (BMI) 28.0-28.9, adult: Secondary | ICD-10-CM | POA: Diagnosis not present

## 2023-03-12 DIAGNOSIS — F32A Depression, unspecified: Secondary | ICD-10-CM | POA: Diagnosis not present

## 2023-03-12 DIAGNOSIS — N951 Menopausal and female climacteric states: Secondary | ICD-10-CM | POA: Diagnosis not present

## 2023-03-12 DIAGNOSIS — M0520 Rheumatoid vasculitis with rheumatoid arthritis of unspecified site: Secondary | ICD-10-CM | POA: Diagnosis not present

## 2023-04-06 DIAGNOSIS — D72819 Decreased white blood cell count, unspecified: Secondary | ICD-10-CM | POA: Diagnosis not present

## 2023-04-06 DIAGNOSIS — M0589 Other rheumatoid arthritis with rheumatoid factor of multiple sites: Secondary | ICD-10-CM | POA: Diagnosis not present

## 2023-04-06 DIAGNOSIS — Z0000 Encounter for general adult medical examination without abnormal findings: Secondary | ICD-10-CM | POA: Diagnosis not present

## 2023-04-13 DIAGNOSIS — M0589 Other rheumatoid arthritis with rheumatoid factor of multiple sites: Secondary | ICD-10-CM | POA: Diagnosis not present

## 2023-04-13 DIAGNOSIS — F419 Anxiety disorder, unspecified: Secondary | ICD-10-CM | POA: Diagnosis not present

## 2023-04-13 DIAGNOSIS — D72819 Decreased white blood cell count, unspecified: Secondary | ICD-10-CM | POA: Diagnosis not present

## 2023-04-13 DIAGNOSIS — Z23 Encounter for immunization: Secondary | ICD-10-CM | POA: Diagnosis not present

## 2023-04-13 DIAGNOSIS — Z0000 Encounter for general adult medical examination without abnormal findings: Secondary | ICD-10-CM | POA: Diagnosis not present

## 2023-04-21 DIAGNOSIS — M0589 Other rheumatoid arthritis with rheumatoid factor of multiple sites: Secondary | ICD-10-CM | POA: Diagnosis not present

## 2023-05-04 ENCOUNTER — Ambulatory Visit: Payer: BC Managed Care – PPO | Admitting: Podiatry

## 2023-05-04 ENCOUNTER — Ambulatory Visit (INDEPENDENT_AMBULATORY_CARE_PROVIDER_SITE_OTHER): Payer: BC Managed Care – PPO

## 2023-05-04 DIAGNOSIS — M21619 Bunion of unspecified foot: Secondary | ICD-10-CM

## 2023-05-04 DIAGNOSIS — M2041 Other hammer toe(s) (acquired), right foot: Secondary | ICD-10-CM

## 2023-05-04 DIAGNOSIS — M21611 Bunion of right foot: Secondary | ICD-10-CM | POA: Diagnosis not present

## 2023-05-11 DIAGNOSIS — M25562 Pain in left knee: Secondary | ICD-10-CM | POA: Diagnosis not present

## 2023-05-11 DIAGNOSIS — M25561 Pain in right knee: Secondary | ICD-10-CM | POA: Diagnosis not present

## 2023-05-14 DIAGNOSIS — N951 Menopausal and female climacteric states: Secondary | ICD-10-CM | POA: Diagnosis not present

## 2023-05-14 DIAGNOSIS — M069 Rheumatoid arthritis, unspecified: Secondary | ICD-10-CM | POA: Diagnosis not present

## 2023-05-14 DIAGNOSIS — F329 Major depressive disorder, single episode, unspecified: Secondary | ICD-10-CM | POA: Diagnosis not present

## 2023-05-14 DIAGNOSIS — N898 Other specified noninflammatory disorders of vagina: Secondary | ICD-10-CM | POA: Diagnosis not present

## 2023-05-19 DIAGNOSIS — Z1231 Encounter for screening mammogram for malignant neoplasm of breast: Secondary | ICD-10-CM | POA: Diagnosis not present

## 2023-05-20 ENCOUNTER — Telehealth: Payer: Self-pay | Admitting: Urology

## 2023-05-20 ENCOUNTER — Telehealth: Payer: Self-pay | Admitting: Podiatry

## 2023-06-05 ENCOUNTER — Ambulatory Visit (INDEPENDENT_AMBULATORY_CARE_PROVIDER_SITE_OTHER): Payer: BC Managed Care – PPO | Admitting: Podiatry

## 2023-06-05 ENCOUNTER — Ambulatory Visit: Payer: BC Managed Care – PPO | Admitting: Podiatry

## 2023-06-05 VITALS — BP 138/85 | HR 70 | Resp 18

## 2023-06-05 DIAGNOSIS — M2041 Other hammer toe(s) (acquired), right foot: Secondary | ICD-10-CM | POA: Diagnosis not present

## 2023-06-05 MED ORDER — HYDROCODONE-ACETAMINOPHEN 10-325 MG PO TABS: 1.0000 | ORAL_TABLET | Freq: Three times a day (TID) | ORAL | 0 refills | Status: AC | PRN

## 2023-06-08 ENCOUNTER — Encounter: Payer: Self-pay | Admitting: Podiatry

## 2023-06-08 ENCOUNTER — Ambulatory Visit (INDEPENDENT_AMBULATORY_CARE_PROVIDER_SITE_OTHER): Payer: BC Managed Care – PPO

## 2023-06-08 ENCOUNTER — Ambulatory Visit: Payer: BC Managed Care – PPO | Admitting: Podiatry

## 2023-06-08 ENCOUNTER — Ambulatory Visit (INDEPENDENT_AMBULATORY_CARE_PROVIDER_SITE_OTHER): Payer: BC Managed Care – PPO | Admitting: Podiatry

## 2023-06-08 DIAGNOSIS — M2041 Other hammer toe(s) (acquired), right foot: Secondary | ICD-10-CM

## 2023-06-12 ENCOUNTER — Encounter: Payer: BC Managed Care – PPO | Admitting: Podiatry

## 2023-06-16 DIAGNOSIS — Z79899 Other long term (current) drug therapy: Secondary | ICD-10-CM | POA: Diagnosis not present

## 2023-06-16 DIAGNOSIS — M0589 Other rheumatoid arthritis with rheumatoid factor of multiple sites: Secondary | ICD-10-CM | POA: Diagnosis not present

## 2023-06-18 DIAGNOSIS — Z124 Encounter for screening for malignant neoplasm of cervix: Secondary | ICD-10-CM | POA: Diagnosis not present

## 2023-06-18 DIAGNOSIS — Z1331 Encounter for screening for depression: Secondary | ICD-10-CM | POA: Diagnosis not present

## 2023-06-18 DIAGNOSIS — Z01419 Encounter for gynecological examination (general) (routine) without abnormal findings: Secondary | ICD-10-CM | POA: Diagnosis not present

## 2023-06-22 ENCOUNTER — Encounter: Payer: BC Managed Care – PPO | Admitting: Podiatry

## 2023-06-22 ENCOUNTER — Encounter: Payer: Self-pay | Admitting: Podiatry

## 2023-06-22 ENCOUNTER — Ambulatory Visit (INDEPENDENT_AMBULATORY_CARE_PROVIDER_SITE_OTHER): Payer: BC Managed Care – PPO

## 2023-06-22 ENCOUNTER — Ambulatory Visit (INDEPENDENT_AMBULATORY_CARE_PROVIDER_SITE_OTHER): Payer: BC Managed Care – PPO | Admitting: Podiatry

## 2023-06-22 DIAGNOSIS — M2041 Other hammer toe(s) (acquired), right foot: Secondary | ICD-10-CM

## 2023-06-22 DIAGNOSIS — Z9889 Other specified postprocedural states: Secondary | ICD-10-CM

## 2023-06-22 DIAGNOSIS — M21622 Bunionette of left foot: Secondary | ICD-10-CM

## 2023-07-06 DIAGNOSIS — Z23 Encounter for immunization: Secondary | ICD-10-CM | POA: Diagnosis not present

## 2023-08-10 DIAGNOSIS — N951 Menopausal and female climacteric states: Secondary | ICD-10-CM | POA: Diagnosis not present

## 2023-08-11 DIAGNOSIS — M0589 Other rheumatoid arthritis with rheumatoid factor of multiple sites: Secondary | ICD-10-CM | POA: Diagnosis not present

## 2023-08-13 DIAGNOSIS — F32A Depression, unspecified: Secondary | ICD-10-CM | POA: Diagnosis not present

## 2023-08-13 DIAGNOSIS — M069 Rheumatoid arthritis, unspecified: Secondary | ICD-10-CM | POA: Diagnosis not present

## 2023-08-13 DIAGNOSIS — N951 Menopausal and female climacteric states: Secondary | ICD-10-CM | POA: Diagnosis not present

## 2023-08-24 DIAGNOSIS — M25552 Pain in left hip: Secondary | ICD-10-CM | POA: Diagnosis not present

## 2023-08-24 DIAGNOSIS — Z79899 Other long term (current) drug therapy: Secondary | ICD-10-CM | POA: Diagnosis not present

## 2023-08-24 DIAGNOSIS — M24542 Contracture, left hand: Secondary | ICD-10-CM | POA: Diagnosis not present

## 2023-08-24 DIAGNOSIS — M0579 Rheumatoid arthritis with rheumatoid factor of multiple sites without organ or systems involvement: Secondary | ICD-10-CM | POA: Diagnosis not present

## 2023-09-07 DIAGNOSIS — L810 Postinflammatory hyperpigmentation: Secondary | ICD-10-CM | POA: Diagnosis not present

## 2023-09-07 DIAGNOSIS — L700 Acne vulgaris: Secondary | ICD-10-CM | POA: Diagnosis not present

## 2023-09-07 DIAGNOSIS — L910 Hypertrophic scar: Secondary | ICD-10-CM | POA: Diagnosis not present

## 2023-11-12 DIAGNOSIS — M0589 Other rheumatoid arthritis with rheumatoid factor of multiple sites: Secondary | ICD-10-CM | POA: Diagnosis not present

## 2023-11-12 DIAGNOSIS — Z79899 Other long term (current) drug therapy: Secondary | ICD-10-CM | POA: Diagnosis not present

## 2023-11-12 DIAGNOSIS — R5383 Other fatigue: Secondary | ICD-10-CM | POA: Diagnosis not present

## 2023-11-18 DIAGNOSIS — N951 Menopausal and female climacteric states: Secondary | ICD-10-CM | POA: Diagnosis not present

## 2023-11-24 DIAGNOSIS — Z6828 Body mass index (BMI) 28.0-28.9, adult: Secondary | ICD-10-CM | POA: Diagnosis not present

## 2023-11-24 DIAGNOSIS — M2550 Pain in unspecified joint: Secondary | ICD-10-CM | POA: Diagnosis not present

## 2023-11-24 DIAGNOSIS — Z79890 Hormone replacement therapy (postmenopausal): Secondary | ICD-10-CM | POA: Diagnosis not present

## 2023-11-24 DIAGNOSIS — N951 Menopausal and female climacteric states: Secondary | ICD-10-CM | POA: Diagnosis not present

## 2023-12-08 DIAGNOSIS — R635 Abnormal weight gain: Secondary | ICD-10-CM | POA: Diagnosis not present

## 2023-12-16 ENCOUNTER — Inpatient Hospital Stay: Attending: Nurse Practitioner | Admitting: Nurse Practitioner

## 2023-12-16 ENCOUNTER — Inpatient Hospital Stay

## 2023-12-16 ENCOUNTER — Other Ambulatory Visit: Payer: Self-pay | Admitting: Nurse Practitioner

## 2023-12-16 VITALS — BP 130/80 | HR 71 | Temp 98.1°F | Resp 17 | Wt 190.6 lb

## 2023-12-16 DIAGNOSIS — D696 Thrombocytopenia, unspecified: Secondary | ICD-10-CM | POA: Insufficient documentation

## 2023-12-16 DIAGNOSIS — Z79899 Other long term (current) drug therapy: Secondary | ICD-10-CM | POA: Diagnosis not present

## 2023-12-16 DIAGNOSIS — M069 Rheumatoid arthritis, unspecified: Secondary | ICD-10-CM | POA: Diagnosis not present

## 2023-12-16 LAB — CBC WITH DIFFERENTIAL (CANCER CENTER ONLY)
Abs Immature Granulocytes: 0.01 K/uL (ref 0.00–0.07)
Basophils Absolute: 0.1 K/uL (ref 0.0–0.1)
Basophils Relative: 1 %
Eosinophils Absolute: 0.1 K/uL (ref 0.0–0.5)
Eosinophils Relative: 2 %
HCT: 42.1 % (ref 36.0–46.0)
Hemoglobin: 14.8 g/dL (ref 12.0–15.0)
Immature Granulocytes: 0 %
Lymphocytes Relative: 37 %
Lymphs Abs: 2.6 K/uL (ref 0.7–4.0)
MCH: 34.1 pg — ABNORMAL HIGH (ref 26.0–34.0)
MCHC: 35.2 g/dL (ref 30.0–36.0)
MCV: 97 fL (ref 80.0–100.0)
Monocytes Absolute: 0.5 K/uL (ref 0.1–1.0)
Monocytes Relative: 8 %
Neutro Abs: 3.6 K/uL (ref 1.7–7.7)
Neutrophils Relative %: 52 %
Platelet Count: 127 K/uL — ABNORMAL LOW (ref 150–400)
RBC: 4.34 MIL/uL (ref 3.87–5.11)
RDW: 12.3 % (ref 11.5–15.5)
Smear Review: NORMAL
WBC Count: 6.9 K/uL (ref 4.0–10.5)
nRBC: 0 % (ref 0.0–0.2)

## 2023-12-16 LAB — VITAMIN B12: Vitamin B-12: 810 pg/mL (ref 180–914)

## 2023-12-16 LAB — SAVE SMEAR(SSMR), FOR PROVIDER SLIDE REVIEW

## 2023-12-16 LAB — FOLATE: Folate: 12.2 ng/mL (ref >5.9)

## 2023-12-16 LAB — IMMATURE PLATELET FRACTION: Immature Platelet Fraction: 2 % (ref 1.2–8.6)

## 2023-12-16 NOTE — Assessment & Plan Note (Signed)
 The patient has history of rheumatoid arthritis.  She is currently on Simponi  Aria to control her symptoms.  She has previously been prescribed methotrexate however, she never started it.  She has been treated for rheumatoid arthritis since the age of 73.  On August 11, 2023, she had regular blood work done for her Simponi  prescription.  Her platelet count was 126.  Her CBC was otherwise normal.  Repeat blood work was done on 11/13/2023.  Her platelet count was 116 with the remainder of her CBC within normal limits.  Prior to April, her blood counts had been normal, including platelets.  She has previously been screened for hepatitis A, B, and C.  Screening was most recently performed on 12/29/2022.  All results were negative. Discussed the most common causes of thrombocytopenia with patient.  Often, condition is related to recent injury, illness, or surgery.  It could be related to autoimmune disorders, such as rheumatoid arthritis.  Problems with the liver and/or spleen can lead to thrombocytopenia.  Most recent labs showed normal LFTs.  It can be caused by medications.  Thrombocytopenia can come from chronic, viral infections.  It can also be idiopathic.  In this particular case, it is likely that her low platelet count is idiopathic and possibly related to rheumatoid arthritis.  She is currently asymptomatic and has not had recent injury, illness, or surgery.  She was screened for hepatitis A, B, and C in 2024, all of which were negative.  We will check labs today including CBC, smear, B12, folate, and HIV.  Will also order an abdominal ultrasound to evaluate for problems with the spleen and liver.  The patient will be contacted with results and plan for necessary follow-up at that time.  If this is ITP, patient can continue to be monitored through rheumatology and primary care.

## 2023-12-16 NOTE — Progress Notes (Addendum)
[facility]  [phone]   HEMATOLOGY ONCOLOGY CONSULTATION   Sharon Valenzuela  DOB: 11-Jul-1970  MR#: [id number]  CSN#: [id number]    Requesting Physician: Dr. Lynwood Ramsay - [facility]  Patient Care Team: Verdia Lombard, MD as PCP - General (Internal Medicine) Verdia Lombard, MD (Internal Medicine)  Reason for consult: thrombocytopenia  History of present illness:   The patient has been referred from her rheumatologist.  She does have history of rheumatoid arthritis.  She is currently on Simponi  Aria to control her symptoms.  She has previously been prescribed methotrexate however, she never started it.  She has been treated for rheumatoid arthritis since the age of 76.  On August 11, 2023, she had regular blood work done for her Simponi  prescription.  Her platelet count was 126.  Her CBC was otherwise normal.  Repeat blood work was done on 11/13/2023.  Her platelet count was 116 with the remainder of her CBC within normal limits.  Prior to April, her blood counts had been normal, including platelets.  She has previously been screened for hepatitis A, B, and C.  Screening was most recently performed on 12/29/2022.  All results were negative. Today, the patient is asymptomatic.  She denies chest pain, chest pressure, or shortness of breath.  She denies fever, chills, unintentional weight loss, or bodyaches.  She does have night sweats which have increased in frequency over the past few months.  She denies nausea, vomiting, diarrhea, or constipation.  She denies abdominal pain.  She denies headaches or visual disturbance.  She denies recent injury or illness.  She does statesshe has been under some increased family stress.  Sometimes, she reports not getting enough sleep and thus, has decreased energy.  She recently started HRT through [facility].  She gets estrogen pellets injected into her hip every 3 months and takes oral progesterone .  When she developed  acne as a result, she was started on spironolactone. Socially, the patient is married and lives with her husband in [place].  She has 1 daughter who is 58 years old.  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  She does not smoke.  She does engage in social drinking approximately 1 time per week.  Will usually have 2 drinks during each episode.  She denies illicit or illegal drug use.  MEDICAL HISTORY:  Past Medical History:  Diagnosis Date   Arthritis    Rheumatoid arthritis(714.0)    Wears glasses     SURGICAL HISTORY: Past Surgical History:  Procedure Laterality Date   CESAREAN SECTION  1994   DILATION AND CURETTAGE OF UTERUS     METATARSAL OSTEOTOMY WITH BUNIONECTOMY Left 07/07/2013   Procedure: LEFT FIRST METATARSAL SCARF OSTEOTOMY MODIFIED MCBRIDE BUNIONECTOMY WITH  AKIN OSTEOTOMY;  Surgeon: Norleen Armor, MD;  Location: [facility];  Service: Orthopedics;  Laterality: Left;   WISDOM TOOTH EXTRACTION      SOCIAL HISTORY: Social History   Socioeconomic History   Marital status: Divorced    Spouse name: Not on file   Number of children: Not on file   Years of education: Not on file   Highest education level: Not on file  Occupational History   Not on file  Tobacco Use   Smoking status: Never   Smokeless tobacco: Never  Substance and Sexual Activity   Alcohol use: Not Currently    Comment: weekends/social   Drug use: Never   Sexual activity: Not on file  Other  Topics Concern   Not on file  Social History Narrative   ** Merged History Encounter **       Social Drivers of Corporate investment banker Strain: Not on file  Food Insecurity: Not on file  Transportation Needs: Not on file  Physical Activity: Not on file  Stress: Not on file  Social Connections: Not on file  Intimate Partner Violence: Not on file    FAMILY HISTORY: Family History  Problem Relation Age of Onset   Breast cancer Neg Hx     ALLERGIES:  has no known  allergies.  MEDICATIONS:  Current Outpatient Medications  Medication Sig Dispense Refill   cholecalciferol (VITAMIN D) 1000 UNITS tablet Take 1,000 Units by mouth daily.     progesterone  (PROMETRIUM ) 100 MG capsule Take 100 mg by mouth daily.     spironolactone (ALDACTONE) 50 MG tablet Take 50-100 mg by mouth daily.     tretinoin (RETIN-A) 0.05 % cream Apply topically at bedtime.     vitamin B-12 (CYANOCOBALAMIN ) 100 MCG tablet Take 100 mcg by mouth daily.     clindamycin  (CLEOCIN ) 300 MG capsule Take 1 capsule (300 mg total) by mouth 3 (three) times daily. (Patient not taking: Reported on 12/16/2023) 30 capsule 0   naproxen (NAPROSYN) 500 MG tablet Take 1 tablet (500 mg total) by mouth 2 (two) times daily. (Patient not taking: Reported on 12/16/2023) 30 tablet 0   No current facility-administered medications for this visit.    REVIEW OF SYSTEMS:   Constitutional: Denies fevers or chills. She reports fatigue and difficulty sleeping some nights. She has noted increased night sweats the past few months. She does get HRT for menopausal symptom control.  Eyes: Denies blurriness of vision, double vision or watery eyes Ears, nose, mouth, throat, and face: Denies mucositis or sore throat Respiratory: Denies cough, dyspnea or wheezes Cardiovascular: Denies palpitation, chest discomfort or lower extremity swelling Gastrointestinal:  Denies nausea, heartburn or change in bowel habits Skin: Denies abnormal skin rashes Lymphatics: Denies new lymphadenopathy or easy bruising Neurological:Denies numbness, tingling or new weaknesses Behavioral/Psych: Mood is stable, no new changes  All other systems were reviewed with the patient and are negative.  PHYSICAL EXAMINATION: ECOG PERFORMANCE STATUS: 0 - Asymptomatic  Vitals:   12/16/23 1423  BP: 130/80  Pulse: 71  Resp: 17  Temp: 98.1 F (36.7 C)  SpO2: 97%   Filed Weights   12/16/23 1423  Weight: 190 lb 9.6 oz (86.5 kg)    GENERAL:alert, no  distress and comfortable SKIN: skin color, texture, turgor are normal, no rashes or significant lesions EYES: normal, conjunctiva are pink and non-injected, sclera clear OROPHARYNX:no exudate, no erythema and lips, buccal mucosa, and tongue normal  NECK: supple, thyroid  normal size, non-tender, without nodularity LYMPH:  no palpable lymphadenopathy in the cervical, axillary or inguinal LUNGS: clear to auscultation and percussion with normal breathing effort HEART: regular rate & rhythm and no murmurs and no lower extremity edema ABDOMEN:abdomen soft, non-tender and normal bowel sounds Musculoskeletal:no cyanosis of digits and no clubbing  PSYCH: alert & oriented x 3 with fluent speech NEURO: no focal motor/sensory deficits  LABORATORY DATA:  I have reviewed the data as listed Lab Results  Component Value Date   WBC 6.9 12/16/2023   HGB 14.8 12/16/2023   HCT 42.1 12/16/2023   MCV 97.0 12/16/2023   PLT 127 (L) 12/16/2023      ASSESSMENT & PLAN:  Thrombocytopenia (HCC) Assessment & Plan: The patient has history of  rheumatoid arthritis.  She is currently on Simponi  Aria to control her symptoms.  She has previously been prescribed methotrexate however, she never started it.  She has been treated for rheumatoid arthritis since the age of 23.  On August 11, 2023, she had regular blood work done for her Simponi  prescription.  Her platelet count was 126.  Her CBC was otherwise normal.  Repeat blood work was done on 11/13/2023.  Her platelet count was 116 with the remainder of her CBC within normal limits.  Prior to April, her blood counts had been normal, including platelets.  She has previously been screened for hepatitis A, B, and C.  Screening was most recently performed on 12/29/2022.  All results were negative. Discussed the most common causes of thrombocytopenia with patient.  Often, condition is related to recent injury, illness, or surgery.  It could be related to autoimmune disorders, such  as rheumatoid arthritis.  Problems with the liver and/or spleen can lead to thrombocytopenia.  Most recent labs showed normal LFTs.  It can be caused by medications.  Thrombocytopenia can come from chronic, viral infections.  It can also be idiopathic.  In this particular case, it is likely that her low platelet count is idiopathic and possibly related to rheumatoid arthritis.  She is currently asymptomatic and has not had recent injury, illness, or surgery.  She was screened for hepatitis A, B, and C in 2024, all of which were negative.  We will check labs today including CBC, smear, B12, folate, and HIV.  Will also order an abdominal ultrasound to evaluate for problems with the spleen and liver.  The patient will be contacted with results and plan for necessary follow-up at that time.  If this is ITP, patient can continue to be monitored through rheumatology and primary care.  Orders: -     CBC with Differential (Cancer Center Only); Future -     Immature Platelet Fraction; Future -     Save Smear for Provider Slide Review; Future -     Vitamin B12; Future -     Folate; Future -     HIV Antibody (routine testing w rflx); Future -     US  Abdomen Complete; Future    The patient was seen along with Dr. Lanny today. All questions were answered. The patient knows to call the clinic with any problems, questions or concerns. Time spent with the patient was approximately 30 minutes. This time included reviewing progress notes, labs, imaging studies, and discussing plan for follow up.        Powell FORBES Lessen, NP 12/16/2023 4:27 PM  Addendum I have seen the patient, examined her. I agree with the assessment and and plan and have edited the notes.   Patient is a 53 year old female with history of rheumatoid arthritis, was referred for mild thrombocytopenia.  Rest of CBC are normal.  No history of hepatitis, HIV, or liver disease.  We discussed the common etiology for mild thrombocytopenia, and I will  check labs, including B12, folate, hepatitis panel, HIV, and ultrasound of abdomen to evaluate the liver and spleen.  If the above workup is negative, she likely has ITP.  We discussed the indication for treatment, and I recommend observation for now.  All questions were answered, will call her with lab results.   Onita Lanny MD 12/16/2023

## 2023-12-16 NOTE — Progress Notes (Signed)
[facility]  [phone]   HEMATOLOGY ONCOLOGY CONSULTATION   Sharon Valenzuela  DOB: 11-29-1970  MR#: [id number]  CSN#: [id number]    Requesting Physician: Dr. Lynwood Ramsay - [facility]  Patient Care Team: Verdia Lombard, MD as PCP - General (Internal Medicine) Verdia Lombard, MD (Internal Medicine)  Reason for consult: thrombocytopenia  History of present illness:   The patient has been referred from her rheumatologist.  She does have history of rheumatoid arthritis.  She is currently on Simponi  Aria to control her symptoms.  She has previously been prescribed methotrexate however, she never started it.  She has been treated for rheumatoid arthritis since the age of 52.  On August 11, 2023, she had regular blood work done for her Simponi  prescription.  Her platelet count was 126.  Her CBC was otherwise normal.  Repeat blood work was done on 11/13/2023.  Her platelet count was 116 with the remainder of her CBC within normal limits.  Prior to April, her blood counts had been normal, including platelets.  She has previously been screened for hepatitis A, B, and C.  Screening was most recently performed on 12/29/2022.  All results were negative. Today, the patient is asymptomatic.  She denies chest pain, chest pressure, or shortness of breath.  She denies fever, chills, unintentional weight loss, or bodyaches.  She does have night sweats which have increased in frequency over the past few months.  She denies nausea, vomiting, diarrhea, or constipation.  She denies abdominal pain.  She denies headaches or visual disturbance.  She denies recent injury or illness.  She does statesshe has been under some increased family stress.  Sometimes, she reports not getting enough sleep and thus, has decreased energy.  She recently started HRT through [facility].  She gets estrogen pellets injected into her hip every 3 months and takes oral progesterone .  When she developed  acne as a result, she was started on spironolactone. Socially, the patient is married and lives with her husband in [place].  She has 1 daughter who is 74 years old.  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  She does not smoke.  She does engage in social drinking approximately 1 time per week.  Will usually have 2 drinks during each episode.  She denies illicit or illegal drug use.  MEDICAL HISTORY:  Past Medical History:  Diagnosis Date   Arthritis    Rheumatoid arthritis(714.0)    Wears glasses     SURGICAL HISTORY: Past Surgical History:  Procedure Laterality Date   CESAREAN SECTION  1994   DILATION AND CURETTAGE OF UTERUS     METATARSAL OSTEOTOMY WITH BUNIONECTOMY Left 07/07/2013   Procedure: LEFT FIRST METATARSAL SCARF OSTEOTOMY MODIFIED MCBRIDE BUNIONECTOMY WITH  AKIN OSTEOTOMY;  Surgeon: Norleen Armor, MD;  Location: [facility];  Service: Orthopedics;  Laterality: Left;   WISDOM TOOTH EXTRACTION      SOCIAL HISTORY: Social History   Socioeconomic History   Marital status: Divorced    Spouse name: Not on file   Number of children: Not on file   Years of education: Not on file   Highest education level: Not on file  Occupational History   Not on file  Tobacco Use   Smoking status: Never   Smokeless tobacco: Never  Substance and Sexual Activity   Alcohol use: Not Currently    Comment: weekends/social   Drug use: Never   Sexual activity: Not on file  Other  Topics Concern   Not on file  Social History Narrative   ** Merged History Encounter **       Social Drivers of Corporate investment banker Strain: Not on file  Food Insecurity: Not on file  Transportation Needs: Not on file  Physical Activity: Not on file  Stress: Not on file  Social Connections: Not on file  Intimate Partner Violence: Not on file    FAMILY HISTORY: Family History  Problem Relation Age of Onset   Breast cancer Neg Hx     ALLERGIES:  has no known  allergies.  MEDICATIONS:  Current Outpatient Medications  Medication Sig Dispense Refill   cholecalciferol (VITAMIN D) 1000 UNITS tablet Take 1,000 Units by mouth daily.     progesterone  (PROMETRIUM ) 100 MG capsule Take 100 mg by mouth daily.     spironolactone (ALDACTONE) 50 MG tablet Take 50-100 mg by mouth daily.     tretinoin (RETIN-A) 0.05 % cream Apply topically at bedtime.     vitamin B-12 (CYANOCOBALAMIN) 100 MCG tablet Take 100 mcg by mouth daily.     clindamycin  (CLEOCIN ) 300 MG capsule Take 1 capsule (300 mg total) by mouth 3 (three) times daily. (Patient not taking: Reported on 12/16/2023) 30 capsule 0   naproxen (NAPROSYN) 500 MG tablet Take 1 tablet (500 mg total) by mouth 2 (two) times daily. (Patient not taking: Reported on 12/16/2023) 30 tablet 0   No current facility-administered medications for this visit.    REVIEW OF SYSTEMS:   Constitutional: Denies fevers or chills. She reports fatigue and difficulty sleeping some nights. She has noted increased night sweats the past few months. She does get HRT for menopausal symptom control.  Eyes: Denies blurriness of vision, double vision or watery eyes Ears, nose, mouth, throat, and face: Denies mucositis or sore throat Respiratory: Denies cough, dyspnea or wheezes Cardiovascular: Denies palpitation, chest discomfort or lower extremity swelling Gastrointestinal:  Denies nausea, heartburn or change in bowel habits Skin: Denies abnormal skin rashes Lymphatics: Denies new lymphadenopathy or easy bruising Neurological:Denies numbness, tingling or new weaknesses Behavioral/Psych: Mood is stable, no new changes  All other systems were reviewed with the patient and are negative.  PHYSICAL EXAMINATION: ECOG PERFORMANCE STATUS: 0 - Asymptomatic  Vitals:   12/16/23 1423  BP: 130/80  Pulse: 71  Resp: 17  Temp: 98.1 F (36.7 C)  SpO2: 97%   Filed Weights   12/16/23 1423  Weight: 190 lb 9.6 oz (86.5 kg)    GENERAL:alert, no  distress and comfortable SKIN: skin color, texture, turgor are normal, no rashes or significant lesions EYES: normal, conjunctiva are pink and non-injected, sclera clear OROPHARYNX:no exudate, no erythema and lips, buccal mucosa, and tongue normal  NECK: supple, thyroid  normal size, non-tender, without nodularity LYMPH:  no palpable lymphadenopathy in the cervical, axillary or inguinal LUNGS: clear to auscultation and percussion with normal breathing effort HEART: regular rate & rhythm and no murmurs and no lower extremity edema ABDOMEN:abdomen soft, non-tender and normal bowel sounds Musculoskeletal:no cyanosis of digits and no clubbing  PSYCH: alert & oriented x 3 with fluent speech NEURO: no focal motor/sensory deficits  LABORATORY DATA:  I have reviewed the data as listed Lab Results  Component Value Date   WBC 6.9 12/16/2023   HGB 14.8 12/16/2023   HCT 42.1 12/16/2023   MCV 97.0 12/16/2023   PLT 127 (L) 12/16/2023      ASSESSMENT & PLAN:  Thrombocytopenia (HCC) Assessment & Plan: The patient has history of  rheumatoid arthritis.  She is currently on Simponi  Aria to control her symptoms.  She has previously been prescribed methotrexate however, she never started it.  She has been treated for rheumatoid arthritis since the age of 18.  On August 11, 2023, she had regular blood work done for her Simponi  prescription.  Her platelet count was 126.  Her CBC was otherwise normal.  Repeat blood work was done on 11/13/2023.  Her platelet count was 116 with the remainder of her CBC within normal limits.  Prior to April, her blood counts had been normal, including platelets.  She has previously been screened for hepatitis A, B, and C.  Screening was most recently performed on 12/29/2022.  All results were negative. Discussed the most common causes of thrombocytopenia with patient.  Often, condition is related to recent injury, illness, or surgery.  It could be related to autoimmune disorders, such  as rheumatoid arthritis.  Problems with the liver and/or spleen can lead to thrombocytopenia.  Most recent labs showed normal LFTs.  It can be caused by medications.  Thrombocytopenia can come from chronic, viral infections.  It can also be idiopathic.  In this particular case, it is likely that her low platelet count is idiopathic and possibly related to rheumatoid arthritis.  She is currently asymptomatic and has not had recent injury, illness, or surgery.  She was screened for hepatitis A, B, and C in 2024, all of which were negative.  We will check labs today including CBC, smear, B12, folate, and HIV.  Will also order an abdominal ultrasound to evaluate for problems with the spleen and liver.  The patient will be contacted with results and plan for necessary follow-up at that time.  If this is ITP, patient can continue to be monitored through rheumatology and primary care.  Orders: -     CBC with Differential (Cancer Center Only); Future -     Immature Platelet Fraction; Future -     Save Smear for Provider Slide Review; Future -     Vitamin B12; Future -     Folate; Future -     HIV Antibody (routine testing w rflx); Future -     US  Abdomen Complete; Future    The patient was seen along with Dr. Lanny today. All questions were answered. The patient knows to call the clinic with any problems, questions or concerns. Time spent with the patient was approximately 30 minutes. This time included reviewing progress notes, labs, imaging studies, and discussing plan for follow up.        Powell FORBES Lessen, NP 12/16/2023 4:27 PM

## 2023-12-17 LAB — HIV ANTIBODY (ROUTINE TESTING W REFLEX): HIV Screen 4th Generation wRfx: NONREACTIVE

## 2023-12-21 ENCOUNTER — Ambulatory Visit
Admission: RE | Admit: 2023-12-21 | Discharge: 2023-12-21 | Disposition: A | Discharge status: Home / Self Care | Source: Ambulatory Visit | Attending: Nurse Practitioner | Admitting: Nurse Practitioner

## 2023-12-21 DIAGNOSIS — D75839 Thrombocytosis, unspecified: Secondary | ICD-10-CM | POA: Diagnosis not present

## 2023-12-21 DIAGNOSIS — D696 Thrombocytopenia, unspecified: Secondary | ICD-10-CM

## 2023-12-22 DIAGNOSIS — Z1331 Encounter for screening for depression: Secondary | ICD-10-CM | POA: Diagnosis not present

## 2023-12-22 DIAGNOSIS — E663 Overweight: Secondary | ICD-10-CM | POA: Diagnosis not present

## 2023-12-22 DIAGNOSIS — D696 Thrombocytopenia, unspecified: Secondary | ICD-10-CM | POA: Diagnosis not present

## 2023-12-22 DIAGNOSIS — N951 Menopausal and female climacteric states: Secondary | ICD-10-CM | POA: Diagnosis not present

## 2023-12-22 DIAGNOSIS — E7800 Pure hypercholesterolemia, unspecified: Secondary | ICD-10-CM | POA: Diagnosis not present

## 2023-12-22 DIAGNOSIS — R718 Other abnormality of red blood cells: Secondary | ICD-10-CM | POA: Diagnosis not present

## 2023-12-28 DIAGNOSIS — L409 Psoriasis, unspecified: Secondary | ICD-10-CM | POA: Diagnosis not present

## 2024-01-07 DIAGNOSIS — Z79899 Other long term (current) drug therapy: Secondary | ICD-10-CM | POA: Diagnosis not present

## 2024-01-07 DIAGNOSIS — M0589 Other rheumatoid arthritis with rheumatoid factor of multiple sites: Secondary | ICD-10-CM | POA: Diagnosis not present

## 2024-01-08 ENCOUNTER — Ambulatory Visit: Payer: Self-pay | Admitting: Nurse Practitioner

## 2024-01-11 DIAGNOSIS — E7800 Pure hypercholesterolemia, unspecified: Secondary | ICD-10-CM | POA: Diagnosis not present

## 2024-01-18 DIAGNOSIS — M2550 Pain in unspecified joint: Secondary | ICD-10-CM | POA: Diagnosis not present

## 2024-01-18 DIAGNOSIS — Z6828 Body mass index (BMI) 28.0-28.9, adult: Secondary | ICD-10-CM | POA: Diagnosis not present

## 2024-01-26 DIAGNOSIS — E7800 Pure hypercholesterolemia, unspecified: Secondary | ICD-10-CM | POA: Diagnosis not present

## 2024-02-01 DIAGNOSIS — E7800 Pure hypercholesterolemia, unspecified: Secondary | ICD-10-CM | POA: Diagnosis not present

## 2024-02-08 DIAGNOSIS — M2550 Pain in unspecified joint: Secondary | ICD-10-CM | POA: Diagnosis not present

## 2024-02-08 DIAGNOSIS — Z6828 Body mass index (BMI) 28.0-28.9, adult: Secondary | ICD-10-CM | POA: Diagnosis not present

## 2024-02-15 DIAGNOSIS — M0579 Rheumatoid arthritis with rheumatoid factor of multiple sites without organ or systems involvement: Secondary | ICD-10-CM | POA: Diagnosis not present

## 2024-02-15 DIAGNOSIS — E7800 Pure hypercholesterolemia, unspecified: Secondary | ICD-10-CM | POA: Diagnosis not present

## 2024-02-15 DIAGNOSIS — M24542 Contracture, left hand: Secondary | ICD-10-CM | POA: Diagnosis not present

## 2024-02-15 DIAGNOSIS — M25562 Pain in left knee: Secondary | ICD-10-CM | POA: Diagnosis not present

## 2024-02-15 DIAGNOSIS — Z79899 Other long term (current) drug therapy: Secondary | ICD-10-CM | POA: Diagnosis not present

## 2024-02-15 DIAGNOSIS — M25552 Pain in left hip: Secondary | ICD-10-CM | POA: Diagnosis not present

## 2024-02-23 DIAGNOSIS — L709 Acne, unspecified: Secondary | ICD-10-CM | POA: Diagnosis not present

## 2024-02-23 DIAGNOSIS — N951 Menopausal and female climacteric states: Secondary | ICD-10-CM | POA: Diagnosis not present

## 2024-02-24 ENCOUNTER — Ambulatory Visit (INDEPENDENT_AMBULATORY_CARE_PROVIDER_SITE_OTHER)

## 2024-02-24 ENCOUNTER — Ambulatory Visit: Payer: Self-pay | Admitting: Podiatry

## 2024-02-24 ENCOUNTER — Encounter: Payer: Self-pay | Admitting: Podiatry

## 2024-02-24 DIAGNOSIS — M7751 Other enthesopathy of right foot: Secondary | ICD-10-CM

## 2024-02-24 DIAGNOSIS — M2041 Other hammer toe(s) (acquired), right foot: Secondary | ICD-10-CM

## 2024-02-24 DIAGNOSIS — Q828 Other specified congenital malformations of skin: Secondary | ICD-10-CM

## 2024-02-24 MED ORDER — TRIAMCINOLONE ACETONIDE 10 MG/ML IJ SUSP
10.0000 mg | Freq: Once | INTRAMUSCULAR | Status: AC
  Administered 2024-02-24: 10 mg via INTRA_ARTICULAR

## 2024-02-24 NOTE — Progress Notes (Signed)
 Subjective:   Patient ID: Sharon Valenzuela, female   DOB: 53 y.o.   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

## 2024-02-26 DIAGNOSIS — M2550 Pain in unspecified joint: Secondary | ICD-10-CM | POA: Diagnosis not present

## 2024-02-26 DIAGNOSIS — N951 Menopausal and female climacteric states: Secondary | ICD-10-CM | POA: Diagnosis not present

## 2024-02-26 DIAGNOSIS — Z79890 Hormone replacement therapy (postmenopausal): Secondary | ICD-10-CM | POA: Diagnosis not present

## 2024-02-26 DIAGNOSIS — Z6828 Body mass index (BMI) 28.0-28.9, adult: Secondary | ICD-10-CM | POA: Diagnosis not present

## 2024-03-01 DIAGNOSIS — M2550 Pain in unspecified joint: Secondary | ICD-10-CM | POA: Diagnosis not present

## 2024-03-01 DIAGNOSIS — Z6828 Body mass index (BMI) 28.0-28.9, adult: Secondary | ICD-10-CM | POA: Diagnosis not present

## 2024-03-01 DIAGNOSIS — E7800 Pure hypercholesterolemia, unspecified: Secondary | ICD-10-CM | POA: Diagnosis not present

## 2024-03-01 DIAGNOSIS — M069 Rheumatoid arthritis, unspecified: Secondary | ICD-10-CM | POA: Diagnosis not present

## 2024-03-03 DIAGNOSIS — R5383 Other fatigue: Secondary | ICD-10-CM | POA: Diagnosis not present

## 2024-03-03 DIAGNOSIS — Z111 Encounter for screening for respiratory tuberculosis: Secondary | ICD-10-CM | POA: Diagnosis not present

## 2024-03-03 DIAGNOSIS — M0589 Other rheumatoid arthritis with rheumatoid factor of multiple sites: Secondary | ICD-10-CM | POA: Diagnosis not present

## 2024-03-15 DIAGNOSIS — E7800 Pure hypercholesterolemia, unspecified: Secondary | ICD-10-CM | POA: Diagnosis not present

## 2024-03-15 DIAGNOSIS — Z6828 Body mass index (BMI) 28.0-28.9, adult: Secondary | ICD-10-CM | POA: Diagnosis not present

## 2024-04-04 ENCOUNTER — Ambulatory Visit: Admitting: Podiatry

## 2024-04-13 ENCOUNTER — Encounter: Payer: Self-pay | Admitting: Podiatry

## 2024-04-13 ENCOUNTER — Ambulatory Visit: Admitting: Podiatry

## 2024-04-13 DIAGNOSIS — D169 Benign neoplasm of bone and articular cartilage, unspecified: Secondary | ICD-10-CM

## 2024-04-13 DIAGNOSIS — L600 Ingrowing nail: Secondary | ICD-10-CM

## 2024-04-13 NOTE — Progress Notes (Signed)
 Subjective:   Patient ID: Sharon Valenzuela, female   DOB: 53 y.o.   MRN: [id number]   HPI Patient presents with aggravation irritation of this fifth digit right and states that what we did at last visit only helped for a short period of time and has ingrown toenail left big toe   ROS      Objective:  Physical Exam  Neurovascular status intact with a lot of irritation of the right fifth digit lesion formation and possibility for small amount of bone or hypertrophic tissue behind it with incurvated lateral border left hallux     Assessment:  Patient is simply not responding after having surgery of the right foot and is having a lot of irritation of tissue and ingrown toenail left     Plan:  H&P both conditions reviewed and I have recommended going back into this area taking out a small amount of bone and then tissue.  I explained that hopefully this will solve the problem cannot guarantee she wants surgery and after extensive review signed consent form and also at the same time I will fix the ingrown toenail of the left big toe.  All questions answered patient scheduled for outpatient procedure which will be done in office surgical environment

## 2024-04-15 DIAGNOSIS — Z0000 Encounter for general adult medical examination without abnormal findings: Secondary | ICD-10-CM | POA: Diagnosis not present

## 2024-04-15 DIAGNOSIS — D72819 Decreased white blood cell count, unspecified: Secondary | ICD-10-CM | POA: Diagnosis not present

## 2024-04-15 DIAGNOSIS — M0589 Other rheumatoid arthritis with rheumatoid factor of multiple sites: Secondary | ICD-10-CM | POA: Diagnosis not present

## 2024-04-15 DIAGNOSIS — Z23 Encounter for immunization: Secondary | ICD-10-CM | POA: Diagnosis not present

## 2024-04-15 DIAGNOSIS — E7841 Elevated Lipoprotein(a): Secondary | ICD-10-CM | POA: Diagnosis not present

## 2024-06-08 ENCOUNTER — Ambulatory Visit: Admitting: Podiatry

## 2024-06-15 ENCOUNTER — Ambulatory Visit: Admitting: Podiatry
